# Patient Record
Sex: Female | Born: 1995 | Race: Black or African American | Hispanic: No | Marital: Single | Smoking: Current every day smoker
Health system: Southern US, Community
[De-identification: ages and names within clinical notes are randomized; demographics above are authoritative.]

## PROBLEM LIST (undated history)

## (undated) DIAGNOSIS — O039 Complete or unspecified spontaneous abortion without complication: Secondary | ICD-10-CM

## (undated) HISTORY — PX: DILATION AND CURETTAGE OF UTERUS: SHX78

---
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
## 2013-06-05 ENCOUNTER — Encounter (HOSPITAL_BASED_OUTPATIENT_CLINIC_OR_DEPARTMENT_OTHER): Payer: Self-pay | Admitting: Emergency Medicine

## 2013-06-05 ENCOUNTER — Emergency Department (HOSPITAL_BASED_OUTPATIENT_CLINIC_OR_DEPARTMENT_OTHER)
Admission: EM | Admit: 2013-06-05 | Discharge: 2013-06-05 | Disposition: A | Discharge status: Home / Self Care | Payer: No Typology Code available for payment source | Attending: Emergency Medicine | Admitting: Emergency Medicine

## 2013-06-05 DIAGNOSIS — B9789 Other viral agents as the cause of diseases classified elsewhere: Secondary | ICD-10-CM | POA: Insufficient documentation

## 2013-06-05 DIAGNOSIS — B349 Viral infection, unspecified: Secondary | ICD-10-CM

## 2013-06-05 DIAGNOSIS — Z3202 Encounter for pregnancy test, result negative: Secondary | ICD-10-CM | POA: Insufficient documentation

## 2013-06-05 DIAGNOSIS — F17200 Nicotine dependence, unspecified, uncomplicated: Secondary | ICD-10-CM | POA: Insufficient documentation

## 2013-06-05 LAB — PREGNANCY, URINE: Preg Test, Ur: NEGATIVE

## 2013-06-05 MED ORDER — ONDANSETRON 8 MG PO TBDP
8.0000 mg | ORAL_TABLET | Freq: Once | ORAL | Status: AC
  Administered 2013-06-05: 8 mg via ORAL
  Filled 2013-06-05: qty 1

## 2013-06-05 MED ORDER — NAPROXEN 375 MG PO TABS: 375.0000 mg | ORAL_TABLET | Freq: Two times a day (BID) | ORAL | Status: DC

## 2013-06-05 MED ORDER — DICYCLOMINE HCL 20 MG PO TABS: 20.0000 mg | ORAL_TABLET | Freq: Two times a day (BID) | ORAL | Status: DC

## 2013-06-05 MED ORDER — NAPROXEN 250 MG PO TABS
500.0000 mg | ORAL_TABLET | Freq: Once | ORAL | Status: AC
  Administered 2013-06-05: 500 mg via ORAL
  Filled 2013-06-05: qty 2

## 2013-06-05 MED ORDER — ACETAMINOPHEN 500 MG PO TABS
1000.0000 mg | ORAL_TABLET | Freq: Once | ORAL | Status: AC
  Administered 2013-06-05: 1000 mg via ORAL
  Filled 2013-06-05: qty 2

## 2013-06-05 MED ORDER — DICYCLOMINE HCL 10 MG PO CAPS
10.0000 mg | ORAL_CAPSULE | Freq: Once | ORAL | Status: AC
  Administered 2013-06-05: 10 mg via ORAL
  Filled 2013-06-05: qty 1

## 2013-06-05 MED ORDER — ONDANSETRON 8 MG PO TBDP: ORAL_TABLET | ORAL | Status: DC

## 2013-06-05 NOTE — ED Notes (Addendum)
C/o "migraine" h/a with vomiting that started on Wednesday but not getting better per pt. Some loose stools per pt. Denies any fevers. States she has not had any meds pta. States able to drink po fluids. States vomited 2 times since midnight.
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
## 2013-07-14 ENCOUNTER — Emergency Department (HOSPITAL_BASED_OUTPATIENT_CLINIC_OR_DEPARTMENT_OTHER): Payer: No Typology Code available for payment source

## 2013-07-14 ENCOUNTER — Encounter (HOSPITAL_BASED_OUTPATIENT_CLINIC_OR_DEPARTMENT_OTHER): Payer: Self-pay | Admitting: Emergency Medicine

## 2013-07-14 ENCOUNTER — Emergency Department (HOSPITAL_BASED_OUTPATIENT_CLINIC_OR_DEPARTMENT_OTHER)
Admission: EM | Admit: 2013-07-14 | Discharge: 2013-07-14 | Disposition: A | Discharge status: Home / Self Care | Payer: No Typology Code available for payment source | Attending: Emergency Medicine | Admitting: Emergency Medicine

## 2013-07-14 DIAGNOSIS — F17200 Nicotine dependence, unspecified, uncomplicated: Secondary | ICD-10-CM | POA: Insufficient documentation

## 2013-07-14 DIAGNOSIS — R5383 Other fatigue: Secondary | ICD-10-CM

## 2013-07-14 DIAGNOSIS — Z79899 Other long term (current) drug therapy: Secondary | ICD-10-CM | POA: Insufficient documentation

## 2013-07-14 DIAGNOSIS — Z791 Long term (current) use of non-steroidal anti-inflammatories (NSAID): Secondary | ICD-10-CM | POA: Insufficient documentation

## 2013-07-14 DIAGNOSIS — R5381 Other malaise: Secondary | ICD-10-CM | POA: Insufficient documentation

## 2013-07-14 DIAGNOSIS — J069 Acute upper respiratory infection, unspecified: Secondary | ICD-10-CM | POA: Insufficient documentation

## 2013-07-14 LAB — RAPID STREP SCREEN (MED CTR MEBANE ONLY): Streptococcus, Group A Screen (Direct): NEGATIVE

## 2013-07-14 NOTE — Discharge Instructions (Signed)

## 2013-07-14 NOTE — ED Provider Notes (Signed)
CSN: [id number]     Arrival date & time 07/14/13  1406 History  This chart was scribed for Glynn OctaveStephen Adon Gehlhausen, MD by Shari HeritageAisha Amuda, ED Scribe. The patient was seen in room MH08/MH08. Patient's care was started at 2:28 PM.  Chief Complaint  Patient presents with  . Sore Throat     The history is provided by the patient. No language interpreter was used.    HPI Comments: Joaquin Bendbony Zanella is a 18 y.o. female who presents to the Emergency Department complaining of constant, moderate sore throat onset yesterday. She reports itching in her throat upon swallowing. There is associated rhinorrhea, fatigue, body aches, and dry cough. She also reports a fever yesterday to 100, and her temperature today at triage was 98.3. She took Catering managerAlka Seltzer at home with no symptom relief. 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 She denies headache, chest pain, shortness of breath, nausea, vomiting, diarrhea or abdominal pain. Her LNMP was 2 weeks ago. She does not take any medications on a regular basis and has no chronic medical conditions. Patient smokes.   History reviewed. No pertinent past medical history. History reviewed. No pertinent past surgical history. No family history on file. History  Substance Use Topics  . Smoking status: Current Every Day Smoker  . Smokeless tobacco: Not on file  . Alcohol Use: Yes     Comment: occasionally   OB History   Grav Para Term Preterm Abortions TAB SAB Ect Mult Living                 Review of Systems A complete 10 system review of systems was obtained and all systems are negative except as noted in the HPI and PMH.     Allergies  Review of patient's allergies indicates no known allergies.  Home Medications   Prior to Admission medications   Medication Sig Start Date End Date Taking? Authorizing Provider  dicyclomine (BENTYL) 20 MG tablet Take 1 tablet (20 mg total) by mouth 2 (two) times daily. 06/05/13   April K Palumbo-Rasch, MD   naproxen (NAPROSYN) 375 MG tablet Take 1 tablet (375 mg total) by mouth 2 (two) times daily. 06/05/13   April K Palumbo-Rasch, MD  ondansetron [facility](ZOFRAN ODT) 8 MG disintegrating tablet 8mg  ODT q8 hours prn nausea 06/05/13   April K Palumbo-Rasch, MD   Triage Vitals: BP 111/52  Pulse 83  Temp(Src) 98.3 F (36.8 C) (Oral)  Resp 16  SpO2 100% Physical Exam  Constitutional: She is oriented to person, place, and time. She appears well-developed and well-nourished. No distress.  HENT:  Head: Normocephalic and atraumatic.  Mouth/Throat: Oropharynx is clear and moist. No oropharyngeal exudate, posterior oropharyngeal edema or posterior oropharyngeal erythema.  Eyes: Conjunctivae and EOM are normal.  Neck: Normal range of motion. Neck supple.  No meningismus.  Cardiovascular: Normal rate, regular rhythm and normal heart sounds.  Exam reveals no gallop and no friction rub.   No murmur heard. Pulmonary/Chest: Effort normal and breath sounds normal. No respiratory distress. She has no wheezes. She has no rales.  Abdominal: Soft. There is no tenderness.  Musculoskeletal: Normal range of motion.  Neurological: She is alert and oriented to person, place, and time.  Skin: Skin is warm and dry.  Psychiatric: She has a normal mood and affect. Her behavior is normal.    ED Course  Procedures (including critical care time) DIAGNOSTIC STUDIES: Oxygen Saturation is 100% on room air, normal by my interpretation.  COORDINATION OF CARE: 2:32 PM- Will order CXR and strep screen. Patient informed of current plan for treatment and evaluation and agrees with plan at this time.  Results for orders placed during the hospital encounter of 07/14/13  RAPID STREP SCREEN      Result Value Ref Range   Streptococcus, Group A Screen (Direct) NEGATIVE  NEGATIVE    Imaging Review Dg Chest 2 View  07/14/2013   CLINICAL DATA:  Two-day history of cough and congestion  EXAM: CHEST  2 VIEW  COMPARISON:  None.  FINDINGS:  The lungs are well-expanded and clear. The cardiopericardial silhouette is normal in size. The pulmonary vascularity is not engorged. The mediastinum is normal in width. There is no pleural effusion or pneumothorax. The observed portions of the bony thorax appear normal.  IMPRESSION: There is no evidence of pneumonia. Mild hyperinflation may be voluntary or could reflect acute bronchitis with air trapping.   Electronically Signed   By: David  SwazilandJordan   On: 07/14/2013 15:06     EKG Interpretation None      MDM   Final diagnoses:  Upper respiratory infection   1 day history of sore throat, bodyaches, chills, rhinorrhea.  No chest pain or SOB.  Vitals stable, no distress.  Well appearing, OP clear, lungs clear. CXR negative. Supportive care for viral syndrome, PO hydration, antipyretics, return precautions discussed.    I personally performed the services described in this documentation, which was scribed in my presence. The recorded information has been reviewed and is accurate.    Glynn OctaveStephen Tayte Childers, MD 07/14/13 [phone]

## 2013-07-14 NOTE — ED Notes (Signed)
Present to ED today for sore throat, onset yesterday, itchy throat upon swallowing. Other symptoms are intermittent fever and chills, cough, running nose and itchy eyes. Afebrile in room. NAD. Tonsils present but no erythemia or swelling noted.

## 2013-07-16 LAB — CULTURE, GROUP A STREP

## 2014-04-19 ENCOUNTER — Emergency Department (HOSPITAL_BASED_OUTPATIENT_CLINIC_OR_DEPARTMENT_OTHER)
Admission: EM | Admit: 2014-04-19 | Discharge: 2014-04-19 | Disposition: A | Discharge status: Home / Self Care | Payer: No Typology Code available for payment source | Attending: Emergency Medicine | Admitting: Emergency Medicine

## 2014-04-19 ENCOUNTER — Encounter (HOSPITAL_BASED_OUTPATIENT_CLINIC_OR_DEPARTMENT_OTHER): Payer: Self-pay

## 2014-04-19 DIAGNOSIS — S6992XA Unspecified injury of left wrist, hand and finger(s), initial encounter: Secondary | ICD-10-CM | POA: Insufficient documentation

## 2014-04-19 DIAGNOSIS — M25532 Pain in left wrist: Secondary | ICD-10-CM

## 2014-04-19 DIAGNOSIS — Z720 Tobacco use: Secondary | ICD-10-CM | POA: Insufficient documentation

## 2014-04-19 DIAGNOSIS — Y92410 Unspecified street and highway as the place of occurrence of the external cause: Secondary | ICD-10-CM | POA: Insufficient documentation

## 2014-04-19 DIAGNOSIS — S299XXA Unspecified injury of thorax, initial encounter: Secondary | ICD-10-CM | POA: Diagnosis present

## 2014-04-19 DIAGNOSIS — Z79899 Other long term (current) drug therapy: Secondary | ICD-10-CM | POA: Insufficient documentation

## 2014-04-19 DIAGNOSIS — Y998 Other external cause status: Secondary | ICD-10-CM | POA: Diagnosis not present

## 2014-04-19 DIAGNOSIS — Y9389 Activity, other specified: Secondary | ICD-10-CM | POA: Insufficient documentation

## 2014-04-19 DIAGNOSIS — R0789 Other chest pain: Secondary | ICD-10-CM

## 2014-04-19 MED ORDER — IBUPROFEN 800 MG PO TABS
800.0000 mg | ORAL_TABLET | Freq: Once | ORAL | Status: AC
  Administered 2014-04-19: 800 mg via ORAL
  Filled 2014-04-19: qty 1
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
## 2014-04-19 NOTE — Discharge Instructions (Signed)
Return to the emergency room with worsening of symptoms, new symptoms or with symptoms that are concerning , especially worsening chest pain, shortness of breath, difficulty breathing, chest pain that feels like a pressure, spreads to left arm or jaw, worse with exertion, associated with nausea, vomiting, shortness of breath and/or sweating.  RICE: Rest, Ice (three cycles of 20 mins on, 20mins off at least twice a day), compression/brace, elevation. Heating pad works well for back pain. Ibuprofen 400mg  (2 tablets 200mg ) every 5-6 hours for 3-5 days.  Chest Wall Pain Chest wall pain is pain in or around the bones and muscles of your chest. It may take up to 6 weeks to get better. It may take longer if you must stay physically active in your work and activities.  CAUSES  Chest wall pain may happen on its own. However, it may be caused by:  A viral illness like the flu.  Injury.  Coughing.  Exercise.  Arthritis.  Fibromyalgia.  Shingles. HOME CARE INSTRUCTIONS   Avoid overtiring physical activity. Try not to strain or perform activities that cause pain. This includes any activities using your chest or your abdominal and side muscles, especially if heavy weights are used.  Put ice on the sore area.  Put ice in a plastic bag.  Place a towel between your skin and the bag.  Leave the ice on for 15-20 minutes per hour while awake for the first 2 days.  Only take over-the-counter or prescription medicines for pain, discomfort, or fever as directed by your caregiver. SEEK IMMEDIATE MEDICAL CARE IF:   Your pain increases, or you are very uncomfortable.  You have a fever.  Your chest pain becomes worse.  You have new, unexplained symptoms.  You have nausea or vomiting.  You feel sweaty or lightheaded.  You have a cough with phlegm (sputum), or you cough up blood. MAKE SURE YOU:   Understand these instructions.  Will watch your condition.  Will get help right away if you  are not doing well or get worse. Document Released: 03/16/2005 Document Revised: 06/08/2011 Document Reviewed: 11/10/2010 [facility] Patient Information 2015 [employer] [place]. This information is not intended to replace advice given to you by your health care provider. Make sure you discuss any questions you have with your health care provider.  Wrist Pain Wrist injuries are frequent in adults and children. A sprain is an injury to the ligaments that hold your bones together. A strain is an injury to muscle or muscle cord-like structures (tendons) from stretching or pulling. Generally, when wrists are moderately tender to touch following a fall or injury, a break in the bone (fracture) may be present. Most wrist sprains or strains are better in 3 to 5 days, but complete healing may take several weeks. HOME CARE INSTRUCTIONS   Put ice on the injured area.  Put ice in a plastic bag.  Place a towel between your skin and the bag.  Leave the ice on for 15-20 minutes, 3-4 times a day, for the first 2 days, or as directed by your health care provider.  Keep your arm raised above the level of your heart whenever possible to reduce swelling and pain.  Rest the injured area for at least 48 hours or as directed by your health care provider.  If a splint or elastic bandage has been applied, use it for as long as directed by your health care provider or until seen by a health care provider for a follow-up exam.  Only take over-the-counter or prescription medicines for pain, discomfort, or fever as directed by your health care provider.  Keep all follow-up appointments. You may need to follow up with a specialist or have follow-up X-rays. Improvement in pain level is not a guarantee that you did not fracture a bone in your wrist. The only way to determine whether or not you have a broken bone is by X-ray. SEEK IMMEDIATE MEDICAL CARE IF:   Your fingers are swollen, very red, white, or cold and blue.  Your  fingers are numb or tingling.  You have increasing pain.  You have difficulty moving your fingers. MAKE SURE YOU:   Understand these instructions.  Will watch your condition.  Will get help right away if you are not doing well or get worse. Document Released: 12/24/2004 Document Revised: 03/21/2013 Document Reviewed: 05/07/2010 [facility] Patient Information 2015 [place], [place]. This information is not intended to replace advice given to you by your health care provider. Make sure you discuss any questions you have with your health care provider.
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
## 2015-04-08 ENCOUNTER — Emergency Department (HOSPITAL_BASED_OUTPATIENT_CLINIC_OR_DEPARTMENT_OTHER)
Admission: EM | Admit: 2015-04-08 | Discharge: 2015-04-08 | Disposition: A | Discharge status: Home / Self Care | Payer: No Typology Code available for payment source | Attending: Emergency Medicine | Admitting: Emergency Medicine

## 2015-04-08 ENCOUNTER — Encounter (HOSPITAL_BASED_OUTPATIENT_CLINIC_OR_DEPARTMENT_OTHER): Payer: Self-pay

## 2015-04-08 DIAGNOSIS — F17200 Nicotine dependence, unspecified, uncomplicated: Secondary | ICD-10-CM | POA: Insufficient documentation

## 2015-04-08 DIAGNOSIS — L02416 Cutaneous abscess of left lower limb: Secondary | ICD-10-CM | POA: Insufficient documentation

## 2015-04-08 DIAGNOSIS — L02214 Cutaneous abscess of groin: Secondary | ICD-10-CM

## 2015-04-08 MED ORDER — HYDROCODONE-ACETAMINOPHEN 5-325 MG PO TABS: 1.0000 | ORAL_TABLET | Freq: Four times a day (QID) | ORAL | Status: DC | PRN

## 2015-04-08 MED ORDER — LIDOCAINE HCL (PF) 1 % IJ SOLN
INTRAMUSCULAR | Status: AC
Start: 2015-04-08 — End: 2015-04-08
  Administered 2015-04-08: 5 mL
  Filled 2015-04-08: qty 5

## 2015-04-08 MED ORDER — SULFAMETHOXAZOLE-TRIMETHOPRIM 800-160 MG PO TABS: 1.0000 | ORAL_TABLET | Freq: Two times a day (BID) | ORAL | Status: AC

## 2015-04-08 MED FILL — SULFAMETHOXAZOLE-TMP DS TAB: 800-160 | 7 days supply | Qty: 14 | Fill #0

## 2015-04-08 MED FILL — HYDROCODON-APAP 5-325: 5-325 | 3 days supply | Qty: 15 | Fill #0

## 2015-04-08 NOTE — ED Provider Notes (Signed)
CSN: [id number]     Arrival date & time 04/08/15  1528 History  By signing my name below, I, Budd PalmerVanessa Kelley, attest that this documentation has been prepared under the direction and in the presence of Geoffery Lyonsouglas Christopherjohn Schiele, MD. Electronically Signed: Budd PalmerVanessa Kelley, ED Scribe. 04/08/2015. 4:34 PM.     Chief Complaint  Patient presents with  . Abscess   Patient is a 20 y.o. female presenting with abscess. The history is provided by the patient. No language interpreter was used.  Abscess Location:  Leg Leg abscess location:  L upper leg Size:  2cm by 3cm  Abscess quality: fluctuance and painful   Duration:  4 days Progression:  Improving Pain details:    Severity:  Mild   Duration:  4 days   Progression:  Improving Chronicity:  Recurrent Relieved by:  Warm compresses Associated symptoms: no fever   Risk factors: prior abscess    HPI Comments: Jill Kelley is a 20 y.o. female smoker who presents to the Emergency Department complaining of a painful, growing abscess to the left inner thigh onset 4 days ago. She notes the pain has been easing off today. She reports a PMHx of the same, which she notes typically will reduce in size and resolve on their own, but because this one has not, she came to the ED. She notes she has applied a warm compress which reduced the size of the abscess, but did not resolve it. Pt denies fever. She reports NKDA.  History reviewed. No pertinent past medical history. History reviewed. No pertinent past surgical history. No family history on file. Social History  Substance Use Topics  . Smoking status: Current Every Day Smoker  . Smokeless tobacco: None  . Alcohol Use: Yes     Comment: daily   OB History    No data available     Review of Systems  Constitutional: Negative for fever.  Musculoskeletal: Positive for myalgias.  Skin: Positive for color change.  All other systems reviewed and are negative.   Allergies  Review of patient's allergies indicates no  known allergies.  Home Medications   Prior to Admission medications   Medication Sig Start Date End Date Taking? Authorizing Provider  HYDROcodone-acetaminophen (NORCO) 5-325 MG tablet Take 1-2 tablets by mouth every 6 (six) hours as needed. 04/08/15   Geoffery Lyonsouglas Kesa Birky, MD  sulfamethoxazole-trimethoprim (BACTRIM DS,SEPTRA DS) 800-160 MG tablet Take 1 tablet by mouth 2 (two) times daily. 04/08/15 04/15/15  Geoffery Lyonsouglas Lenwood Balsam, MD   BP 120/68 mmHg  Pulse 82  Temp(Src) 98.9 F (37.2 C) (Oral)  Resp 16  Ht 5\' 5"  (1.651 m)  Wt 180 lb (81.647 kg)  BMI 29.95 kg/m2  SpO2 100%  LMP 04/01/2015 Physical Exam  Constitutional: She is oriented to person, place, and time. She appears well-developed and well-nourished.  HENT:  Head: Normocephalic and atraumatic.  Eyes: Conjunctivae are normal. Right eye exhibits no discharge. Left eye exhibits no discharge.  Pulmonary/Chest: Effort normal. No respiratory distress.  Neurological: She is alert and oriented to person, place, and time. Coordination normal.  Skin: Skin is warm and dry. No rash noted. She is not diaphoretic. No erythema.  The medial aspect of the left upper thigh has a 2 cm by 3 cm fluctuant, tender area.   Psychiatric: She has a normal mood and affect.  Nursing note and vitals reviewed.   ED Course  Procedures  DIAGNOSTIC STUDIES: Oxygen Saturation is 100% on RA, normal by my interpretation.    COORDINATION OF  CARE: 3:58 PM - Discussed plans to perform an I&D and to prescribe antibiotics. Will give a note for work. Pt advised of plan for treatment and pt agrees.  4:08 PM- Advised pt to use warm water soaks to keep the area clean. Will order bactrim and Norco for pain. Pt advised of plan for treatment and pt agrees.   INCISION AND DRAINAGE PROCEDURE NOTE: Patient identification was confirmed and verbal consent was obtained. This procedure was performed by Geoffery Lyons, MD at 4:05 PM. Site: Medial aspect of the left upper thigh Sterile  procedures observed Needle size: 27 gage Anesthetic used (type and amt): Lidocaine 1% injection 3 mL Blade size: 11 Drainage: Purulent  Complexity: Complex Site anesthetized, incision made over site, wound drained and explored loculations, rinsed with copious amounts of normal saline, wound packed with sterile gauze, covered with dry, sterile dressing.  Pt tolerated procedure well without complications.  Instructions for care discussed verbally and pt provided with additional written instructions for homecare and f/u.   Labs Review Labs Reviewed - No data to display  Imaging Review No results found. I have personally reviewed and evaluated these images and lab results as part of my medical decision-making.   EKG Interpretation None      MDM   Final diagnoses:  Abscess of left groin    Abscess I&D of purulent material. She will be treated with Bactrim, warm soaks, and when necessary return.  I personally performed the services described in this documentation, which was scribed in my presence. The recorded information has been reviewed and is accurate.      Geoffery Lyons, MD 04/08/15 [phone]

## 2015-04-08 NOTE — ED Notes (Signed)
C/o abscess to left thigh x 4 days

## 2015-04-08 NOTE — Discharge Instructions (Signed)
Bactrim as prescribed.  Hydrocodone as prescribed as needed for pain.  Apply warm soaks as frequently as possible for the next several days.  Return to the emergency department if symptoms significantly worsen or change.   Abscess An abscess is an infected area that contains a collection of pus and debris.It can occur in almost any part of the body. An abscess is also known as a furuncle or boil. CAUSES  An abscess occurs when tissue gets infected. This can occur from blockage of oil or sweat glands, infection of hair follicles, or a minor injury to the skin. As the body tries to fight the infection, pus collects in the area and creates pressure under the skin. This pressure causes pain. People with weakened immune systems have difficulty fighting infections and get certain abscesses more often.  SYMPTOMS Usually an abscess develops on the skin and becomes a painful mass that is red, warm, and tender. If the abscess forms under the skin, you may feel a moveable soft area under the skin. Some abscesses break open (rupture) on their own, but most will continue to get worse without care. The infection can spread deeper into the body and eventually into the bloodstream, causing you to feel ill.  DIAGNOSIS  Your caregiver will take your medical history and perform a physical exam. A sample of fluid may also be taken from the abscess to determine what is causing your infection. TREATMENT  Your caregiver may prescribe antibiotic medicines to fight the infection. However, taking antibiotics alone usually does not cure an abscess. Your caregiver may need to make a small cut (incision) in the abscess to drain the pus. In some cases, gauze is packed into the abscess to reduce pain and to continue draining the area. HOME CARE INSTRUCTIONS   Only take over-the-counter or prescription medicines for pain, discomfort, or fever as directed by your caregiver.  If you were prescribed antibiotics, take them as  directed. Finish them even if you start to feel better.  If gauze is used, follow your caregiver's directions for changing the gauze.  To avoid spreading the infection:  Keep your draining abscess covered with a bandage.  Wash your hands well.  Do not share personal care items, towels, or whirlpools with others.  Avoid skin contact with others.  Keep your skin and clothes clean around the abscess.  Keep all follow-up appointments as directed by your caregiver. SEEK MEDICAL CARE IF:   You have increased pain, swelling, redness, fluid drainage, or bleeding.  You have muscle aches, chills, or a general ill feeling.  You have a fever. MAKE SURE YOU:   Understand these instructions.  Will watch your condition.  Will get help right away if you are not doing well or get worse.   This information is not intended to replace advice given to you by your health care provider. Make sure you discuss any questions you have with your health care provider.   Document Released: 12/24/2004 Document Revised: 09/15/2011 Document Reviewed: 05/29/2011 Elsevier Interactive Patient Education [employer]2016 Elsevier Inc.

## 2015-11-04 ENCOUNTER — Emergency Department (HOSPITAL_BASED_OUTPATIENT_CLINIC_OR_DEPARTMENT_OTHER)
Admission: EM | Admit: 2015-11-04 | Discharge: 2015-11-04 | Disposition: A | Discharge status: Home / Self Care | Payer: No Typology Code available for payment source | Attending: Emergency Medicine | Admitting: Emergency Medicine

## 2015-11-04 ENCOUNTER — Encounter (HOSPITAL_BASED_OUTPATIENT_CLINIC_OR_DEPARTMENT_OTHER): Payer: Self-pay

## 2015-11-04 DIAGNOSIS — R1084 Generalized abdominal pain: Secondary | ICD-10-CM | POA: Insufficient documentation

## 2015-11-04 DIAGNOSIS — F17210 Nicotine dependence, cigarettes, uncomplicated: Secondary | ICD-10-CM | POA: Insufficient documentation

## 2015-11-04 LAB — URINE MICROSCOPIC-ADD ON

## 2015-11-04 LAB — URINALYSIS, ROUTINE W REFLEX MICROSCOPIC
BILIRUBIN URINE: NEGATIVE
Glucose, UA: NEGATIVE mg/dL
KETONES UR: NEGATIVE mg/dL
Leukocytes, UA: NEGATIVE
NITRITE: NEGATIVE
Protein, ur: NEGATIVE mg/dL
Specific Gravity, Urine: 1.021 (ref 1.005–1.030)
pH: 8.5 — ABNORMAL HIGH (ref 5.0–8.0)

## 2015-11-04 LAB — PREGNANCY, URINE: PREG TEST UR: NEGATIVE

## 2015-11-04 NOTE — ED Triage Notes (Signed)
C/o abd pain, burping started last Friday-NAD-steady gait

## 2015-11-04 NOTE — Discharge Instructions (Signed)
Avoid greasy fried or fatty foods. Avoid fast food such as [employer]. It lots of fruits and vegetables. Take Gas-X as directed for gas. See your primary care physician at [facility] if not improved in a week

## 2015-11-04 NOTE — ED Provider Notes (Addendum)
[facility] Provider Note   CSN: [id number] Arrival date & time: 11/04/15  1442  First Provider Contact:   First MD Initiated Contact with Patient 11/04/15 1643       By signing my name below, I, Jill Kelley, attest that this documentation has been prepared under the direction and in the presence of Doug SouSam [name], MD. Electronically Signed: Soijett Kelley, ED Scribe. 11/04/15. 5:10 PM.    History   Chief Complaint Chief Complaint  Patient presents with  . Abdominal Pain    HPI  Jill Kelley is a 20 y.o. female who presents to the Emergency Department complaining of Generalized diffuse abdominal pain onset 2 weeks. Pt describes her abdominal pain as a "gassy" sensation that is intermittently alleviated with bowel movements or burping. Pt states that she doesn't have any abdominal pain at this time. Pt reports that she last ate [employer] today. Pt notes that her last bowel movement was today and was normal. Pt states that she is currently sexually active. She states that she is having associated symptoms of constipation x 1 week. She states that she has not tried any medications for the relief for her symptoms. She denies blood in stool, dysuria, fever, dyspareunia, and any other symptoms. Pt notes that she does smoke cigarettes. Pt endorses ETOH use and marijuana use. Pt states that she did have a PCP at [facility] who she saw last year.  Denies allergies to medications.   The history is provided by the patient. No language interpreter was used.    History reviewed. No pertinent past medical history.  There are no active problems to display for this patient.   History reviewed. No pertinent surgical history.  OB History    No data available       Home Medications    Prior to Admission medications   Not on File    Family History No family history on file.  Social History Social History  Substance Use Topics  . Smoking status: Current Every Day Smoker   Types: Cigarettes  . Smokeless tobacco: Never Used  . Alcohol use Yes     Comment: daily     Allergies   Review of patient's allergies indicates no known allergies.   Review of Systems Review of Systems  Constitutional: Negative.   HENT: Negative.   Respiratory: Negative.   Cardiovascular: Negative.   Gastrointestinal: Positive for abdominal pain and constipation.  Musculoskeletal: Negative.   Skin: Negative.   Neurological: Negative.   Psychiatric/Behavioral: Negative.   All other systems reviewed and are negative.    Physical Exam Updated Vital Signs BP 132/61 (BP Location: Right Arm)   Pulse 70   Temp 98.7 F (37.1 C) (Oral)   Resp 16   Ht 5\' 5"  (1.651 m)   Wt 166 lb (75.3 kg)   LMP 11/04/2015   SpO2 100%   BMI 27.62 kg/m   Physical Exam  Constitutional: She appears well-developed and well-nourished.  HENT:  Head: Normocephalic and atraumatic.  Eyes: Conjunctivae are normal. Pupils are equal, round, and reactive to light.  Neck: Neck supple. No tracheal deviation present. No thyromegaly present.  Cardiovascular: Normal rate and regular rhythm.   No murmur heard. Pulmonary/Chest: Effort normal and breath sounds normal.  Abdominal: Soft. Bowel sounds are normal. She exhibits no distension. There is no tenderness.  Obese  Musculoskeletal: Normal range of motion. She exhibits no edema or tenderness.  Neurological: She is alert. Coordination normal.  Skin: Skin is warm and dry.  No rash noted.  Psychiatric: She has a normal mood and affect.  Nursing note and vitals reviewed.    ED Treatments / Results  DIAGNOSTIC STUDIES: Oxygen Saturation is 100% on RA, nl by my interpretation.    COORDINATION OF CARE: 5:10 PM Discussed treatment plan with pt at bedside which includes UA and pt agreed to plan.   Labs (all labs ordered are listed, but only abnormal results are displayed) Labs Reviewed  PREGNANCY, URINE  URINALYSIS, ROUTINE W REFLEX MICROSCOPIC (NOT  AT [facility])    EKG  EKG Interpretation None       Radiology No results found.  Procedures Procedures (including critical care time)  Medications Ordered in ED Medications - No data to display   Initial Impression / Assessment and Plan / ED Course  I have reviewed the triage vital signs and the nursing notes.  Pertinent labs & imaging results that were available during my care of the patient were reviewed by me and considered in my medical decision making (see chart for details).  Clinical Course   6 PM remains asymptomatic. Results for orders placed or performed during the hospital encounter of 11/04/15  Pregnancy, urine  Result Value Ref Range   Preg Test, Ur NEGATIVE NEGATIVE  Urinalysis, Routine w reflex microscopic (not at [facility])  Result Value Ref Range   Color, Urine YELLOW YELLOW   APPearance CLEAR CLEAR   Specific Gravity, Urine 1.021 1.005 - 1.030   pH 8.5 (H) 5.0 - 8.0   Glucose, UA NEGATIVE NEGATIVE mg/dL   Hgb urine dipstick TRACE (A) NEGATIVE   Bilirubin Urine NEGATIVE NEGATIVE   Ketones, ur NEGATIVE NEGATIVE mg/dL   Protein, ur NEGATIVE NEGATIVE mg/dL   Nitrite NEGATIVE NEGATIVE   Leukocytes, UA NEGATIVE NEGATIVE  Urine microscopic-add on  Result Value Ref Range   Squamous Epithelial / LPF 0-5 (A) NONE SEEN   WBC, UA 0-5 0 - 5 WBC/hpf   RBC / HPF 0-5 0 - 5 RBC/hpf   Bacteria, UA RARE (A) NONE SEEN   Urine-Other MUCOUS PRESENT    No results found. Suggest Gas-X as directed. Avoid greasy fried or fatty foods or fast foods. Follow-up at [facility]D at [facility] if not better in a week Final Clinical Impressions(s) / ED Diagnoses  Diagnosis nonspecific abdominal pain Final diagnoses:  None    New Prescriptions New Prescriptions   No medications on file        Doug Sou, MD 11/04/15 1813    Doug Sou, MD 11/04/15 1191

## 2016-06-01 ENCOUNTER — Encounter (HOSPITAL_BASED_OUTPATIENT_CLINIC_OR_DEPARTMENT_OTHER): Payer: Self-pay

## 2016-06-01 ENCOUNTER — Emergency Department (HOSPITAL_BASED_OUTPATIENT_CLINIC_OR_DEPARTMENT_OTHER)
Admission: EM | Admit: 2016-06-01 | Discharge: 2016-06-02 | Disposition: A | Discharge status: Home / Self Care | Payer: Medicaid Other | Attending: Emergency Medicine | Admitting: Emergency Medicine

## 2016-06-01 ENCOUNTER — Emergency Department (HOSPITAL_BASED_OUTPATIENT_CLINIC_OR_DEPARTMENT_OTHER): Payer: Medicaid Other

## 2016-06-01 DIAGNOSIS — Z87891 Personal history of nicotine dependence: Secondary | ICD-10-CM | POA: Insufficient documentation

## 2016-06-01 DIAGNOSIS — O209 Hemorrhage in early pregnancy, unspecified: Secondary | ICD-10-CM | POA: Diagnosis present

## 2016-06-01 DIAGNOSIS — Z3A01 Less than 8 weeks gestation of pregnancy: Secondary | ICD-10-CM | POA: Insufficient documentation

## 2016-06-01 DIAGNOSIS — N939 Abnormal uterine and vaginal bleeding, unspecified: Secondary | ICD-10-CM

## 2016-06-01 DIAGNOSIS — O200 Threatened abortion: Secondary | ICD-10-CM | POA: Insufficient documentation

## 2016-06-01 LAB — CBC WITH DIFFERENTIAL/PLATELET
BASOS PCT: 0 %
Basophils Absolute: 0 10*3/uL (ref 0.0–0.1)
Eosinophils Absolute: 0.1 10*3/uL (ref 0.0–0.7)
Eosinophils Relative: 1 %
HCT: 33 % — ABNORMAL LOW (ref 36.0–46.0)
Hemoglobin: 11.1 g/dL — ABNORMAL LOW (ref 12.0–15.0)
Lymphocytes Relative: 27 %
Lymphs Abs: 2.6 10*3/uL (ref 0.7–4.0)
MCH: 23.5 pg — AB (ref 26.0–34.0)
MCHC: 33.6 g/dL (ref 30.0–36.0)
MCV: 69.9 fL — ABNORMAL LOW (ref 78.0–100.0)
MONO ABS: 1.1 10*3/uL — AB (ref 0.1–1.0)
Monocytes Relative: 11 %
NEUTROS PCT: 61 %
Neutro Abs: 5.8 10*3/uL (ref 1.7–7.7)
PLATELETS: 270 10*3/uL (ref 150–400)
RBC: 4.72 MIL/uL (ref 3.87–5.11)
RDW: 13.1 % (ref 11.5–15.5)
WBC: 9.6 10*3/uL (ref 4.0–10.5)

## 2016-06-01 LAB — URINALYSIS, ROUTINE W REFLEX MICROSCOPIC
Bilirubin Urine: NEGATIVE
GLUCOSE, UA: NEGATIVE mg/dL
KETONES UR: NEGATIVE mg/dL
LEUKOCYTES UA: NEGATIVE
NITRITE: NEGATIVE
PROTEIN: NEGATIVE mg/dL
Specific Gravity, Urine: 1.019 (ref 1.005–1.030)
pH: 7 (ref 5.0–8.0)

## 2016-06-01 LAB — PREGNANCY, URINE: PREG TEST UR: POSITIVE — AB

## 2016-06-01 LAB — ABO/RH: ABO/RH(D): O POS

## 2016-06-01 LAB — URINALYSIS, MICROSCOPIC (REFLEX)

## 2016-06-01 LAB — WET PREP, GENITAL
Clue Cells Wet Prep HPF POC: NONE SEEN
Sperm: NONE SEEN
TRICH WET PREP: NONE SEEN
YEAST WET PREP: NONE SEEN

## 2016-06-01 LAB — HCG, QUANTITATIVE, PREGNANCY: HCG, BETA CHAIN, QUANT, S: 4949 m[IU]/mL — AB (ref <5)

## 2016-06-01 NOTE — ED Triage Notes (Signed)
C/o vaginal bleeding x 1 hour-pad x first in place-pt states she is [redacted] weeks pregnant from [facility]- G1-no US-first OB appt for 3/12-NAD-steady gait

## 2016-06-01 NOTE — ED Provider Notes (Signed)
[facility] Provider Note   CSN: [id number] Arrival date & time: 06/01/16  2016   By signing my name below, I, Clovis Pu, attest that this documentation has been prepared under the direction and in the presence of Tilden Fossa, MD  Electronically Signed: Clovis Pu, ED Scribe. 06/01/16. 9:51 PM.   History   Chief Complaint Chief Complaint  Patient presents with  . Vaginal Bleeding  . Routine Prenatal Visit   The history is provided by the patient. No language interpreter was used.   HPI Comments:  Jill Kelley is a 21 y.o. female who presents to the Emergency Department complaining of acute onset, mild vaginal bleeding onset 1 hour PTA. Pt also reports brown discharge and notes she is [redacted] weeks pregnant based off of her cycle. No alleviating factors noted. Pt denies abdominal pain, nausea, vomiting, any recent sexual intercourse, any recent abdominal trauma or any other associated symptoms. No other complaints noted.   History reviewed. No pertinent past medical history.  There are no active problems to display for this patient.   History reviewed. No pertinent surgical history.  OB History    Gravida Para Term Preterm AB Living   1             SAB TAB Ectopic Multiple Live Births                   Home Medications    Prior to Admission medications   Not on File    Family History No family history on file.  Social History Social History  Substance Use Topics  . Smoking status: Former Games developer  . Smokeless tobacco: Never Used  . Alcohol use No     Allergies   Patient has no known allergies.   Review of Systems Review of Systems  Gastrointestinal: Negative for abdominal pain, nausea and vomiting.  Genitourinary: Positive for vaginal bleeding and vaginal discharge.  All other systems reviewed and are negative.  Physical Exam Updated Vital Signs BP 139/81 (BP Location: Left Arm)   Pulse 100   Temp 98.8 F (37.1 C) (Oral)   Resp 18    Ht 5\' 6"  (1.676 m)   Wt 177 lb (80.3 kg)   LMP 03/15/2016   SpO2 100%   BMI 28.57 kg/m   Physical Exam  Constitutional: She is oriented to person, place, and time. She appears well-developed and well-nourished.  HENT:  Head: Normocephalic and atraumatic.  Cardiovascular: Normal rate and regular rhythm.   No murmur heard. Pulmonary/Chest: Effort normal and breath sounds normal. No respiratory distress.  Abdominal: Soft. There is no tenderness. There is no rebound and no guarding.  Genitourinary:  Genitourinary Comments: Moderate vaginal bleeding.  Os fingertip open.  No CMT.  Musculoskeletal: She exhibits no edema or tenderness.  Neurological: She is alert and oriented to person, place, and time.  Skin: Skin is warm and dry.  Psychiatric: She has a normal mood and affect. Her behavior is normal.  Nursing note and vitals reviewed.   ED Treatments / Results  DIAGNOSTIC STUDIES:  Oxygen Saturation is 100% on RA, normal by my interpretation.    COORDINATION OF CARE:  9:50 PM Discussed treatment plan with pt at bedside and pt agreed to plan.  Labs (all labs ordered are listed, but only abnormal results are displayed) Labs Reviewed  URINALYSIS, ROUTINE W REFLEX MICROSCOPIC - Abnormal; Notable for the following:       Result Value   Hgb urine dipstick LARGE (*)  All other components within normal limits  PREGNANCY, URINE - Abnormal; Notable for the following:    Preg Test, Ur POSITIVE (*)    All other components within normal limits  CBC WITH DIFFERENTIAL/PLATELET - Abnormal; Notable for the following:    Hemoglobin 11.1 (*)    HCT 33.0 (*)    MCV 69.9 (*)    MCH 23.5 (*)    Monocytes Absolute 1.1 (*)    All other components within normal limits  HCG, QUANTITATIVE, PREGNANCY - Abnormal; Notable for the following:    hCG, Beta Chain, Quant, S 4,949 (*)    All other components within normal limits  URINALYSIS, MICROSCOPIC (REFLEX) - Abnormal; Notable for the following:      Bacteria, UA FEW (*)    Squamous Epithelial / LPF 0-5 (*)    All other components within normal limits  WET PREP, GENITAL  ABO/RH  GC/CHLAMYDIA PROBE AMP ([place]) NOT AT [facility]    EKG  EKG Interpretation None       Radiology US Ob Comp < 14 Wks  Result Date: 06/01/2016 CLINICAL DATA:  Vaginal bleeding in the first trimester pregnancy. EXAM: OBSTETRIC <14 WK [place] AND TRANSVAGINAL OB US TECHNIQUE: Both transabdominal and transvaginal ultrasound examinations were performed for complete evaluation of the gestation as well as the maternal uterus, adnexal regions, and pelvic cul-de-sac. Transvaginal technique was performed to assess early pregnancy. COMPARISON:  None. FINDINGS: Intrauterine gestational sac: Single Yolk sac:  Not Visualized. Embryo:  Visualized. Cardiac Activity: Not Visualized. Heart Rate: 0  bpm CRL:  6.7  mm   6 w 4 d                  [place] EDC: 01/21/2017 Subchorionic hemorrhage:  Moderate along the fundal portion. Maternal uterus/adnexae: Corpus luteal cyst on the right measuring 2.9 cm in diameter. IMPRESSION: Mishapened gestational sac without yolk sac and no heartbeat. Moderate-sized perigestational hematoma. Findings are suspicious but not yet definitive for failed pregnancy. Recommend follow-up US in 10-14 days for definitive diagnosis. This recommendation follows SRU consensus guidelines: Diagnostic Criteria for Nonviable Pregnancy Early in the First Trimester. Malva Limes Med 2013; 161:0960-45. Electronically Signed   By: Tollie Eth M.D.   On: 06/01/2016 23:11   US Ob Transvaginal  Result Date: 06/01/2016 CLINICAL DATA:  Vaginal bleeding in the first trimester pregnancy. EXAM: OBSTETRIC <14 WK [place] AND TRANSVAGINAL OB US TECHNIQUE: Both transabdominal and transvaginal ultrasound examinations were performed for complete evaluation of the gestation as well as the maternal uterus, adnexal regions, and pelvic cul-de-sac. Transvaginal technique was performed to assess early  pregnancy. COMPARISON:  None. FINDINGS: Intrauterine gestational sac: Single Yolk sac:  Not Visualized. Embryo:  Visualized. Cardiac Activity: Not Visualized. Heart Rate: 0  bpm CRL:  6.7  mm   6 w 4 d                  [place] EDC: 01/21/2017 Subchorionic hemorrhage:  Moderate along the fundal portion. Maternal uterus/adnexae: Corpus luteal cyst on the right measuring 2.9 cm in diameter. IMPRESSION: Mishapened gestational sac without yolk sac and no heartbeat. Moderate-sized perigestational hematoma. Findings are suspicious but not yet definitive for failed pregnancy. Recommend follow-up US in 10-14 days for definitive diagnosis. This recommendation follows SRU consensus guidelines: Diagnostic Criteria for Nonviable Pregnancy Early in the First Trimester. Malva Limes Med 2013; 409:8119-14. Electronically Signed   By: Tollie Eth M.D.   On: 06/01/2016 23:11    Procedures Procedures (including  critical care time)  Medications Ordered in ED Medications - No data to display   Initial Impression / Assessment and Plan / ED Course  I have reviewed the triage vital signs and the nursing notes.  Pertinent labs & imaging results that were available during my care of the patient were reviewed by me and considered in my medical decision making (see chart for details).     Patient here for evaluation of vaginal bleeding. Examination and pelvic ultrasound are concerning for impending miscarriage.  Discussed with patient concerns. Consultation on home care, OB/GYN follow-up as well as close return precautions.  Final Clinical Impressions(s) / ED Diagnoses   Final diagnoses:  Vaginal bleeding  Ectopic pregnancy    New Prescriptions New Prescriptions   No medications on file  I personally performed the services described in this documentation, which was scribed in my presence. The recorded information has been reviewed and is accurate.     Tilden FossaElizabeth Michale Emmerich, MD 06/02/16 [phone]

## 2016-06-01 NOTE — ED Notes (Signed)
Back from US.

## 2016-06-01 NOTE — ED Notes (Signed)
Patient transported to Ultrasound 

## 2016-06-02 NOTE — ED Notes (Signed)
Alert, NAD, calm, interactive, resps e/u, speaking in clear complete sentences, no dyspnea noted, skin W&D, VSS, (denies: pain, sob, nausea, dizziness or visual changes). Family at BS. 

## 2016-06-02 NOTE — Discharge Instructions (Signed)
Please follow up with your OBGYN for recheck.  Your pregnancy hormone (beta HCG) was 4969 today.

## 2016-06-03 LAB — GC/CHLAMYDIA PROBE AMP (~~LOC~~) NOT AT ARMC
CHLAMYDIA, DNA PROBE: NEGATIVE
Neisseria Gonorrhea: NEGATIVE

## 2016-08-03 ENCOUNTER — Emergency Department (HOSPITAL_BASED_OUTPATIENT_CLINIC_OR_DEPARTMENT_OTHER)
Admission: EM | Admit: 2016-08-03 | Discharge: 2016-08-03 | Disposition: A | Discharge status: Home / Self Care | Payer: Medicaid Other | Attending: Emergency Medicine | Admitting: Emergency Medicine

## 2016-08-03 ENCOUNTER — Encounter (HOSPITAL_BASED_OUTPATIENT_CLINIC_OR_DEPARTMENT_OTHER): Payer: Self-pay | Admitting: Emergency Medicine

## 2016-08-03 DIAGNOSIS — N939 Abnormal uterine and vaginal bleeding, unspecified: Secondary | ICD-10-CM | POA: Diagnosis present

## 2016-08-03 DIAGNOSIS — Z87891 Personal history of nicotine dependence: Secondary | ICD-10-CM | POA: Diagnosis not present

## 2016-08-03 DIAGNOSIS — N946 Dysmenorrhea, unspecified: Secondary | ICD-10-CM | POA: Diagnosis not present

## 2016-08-03 LAB — WET PREP, GENITAL
Clue Cells Wet Prep HPF POC: NONE SEEN
SPERM: NONE SEEN
TRICH WET PREP: NONE SEEN
YEAST WET PREP: NONE SEEN

## 2016-08-03 LAB — CBC WITH DIFFERENTIAL/PLATELET
BASOS ABS: 0.1 10*3/uL (ref 0.0–0.1)
BASOS PCT: 1 %
EOS ABS: 0.1 10*3/uL (ref 0.0–0.7)
EOS PCT: 1 %
HCT: 32.8 % — ABNORMAL LOW (ref 36.0–46.0)
Hemoglobin: 11.1 g/dL — ABNORMAL LOW (ref 12.0–15.0)
LYMPHS ABS: 2 10*3/uL (ref 0.7–4.0)
Lymphocytes Relative: 20 %
MCH: 24.2 pg — AB (ref 26.0–34.0)
MCHC: 33.8 g/dL (ref 30.0–36.0)
MCV: 71.5 fL — ABNORMAL LOW (ref 78.0–100.0)
Monocytes Absolute: 0.8 10*3/uL (ref 0.1–1.0)
Monocytes Relative: 8 %
Neutro Abs: 6.9 10*3/uL (ref 1.7–7.7)
Neutrophils Relative %: 71 %
PLATELETS: 278 10*3/uL (ref 150–400)
RBC: 4.59 MIL/uL (ref 3.87–5.11)
RDW: 13.7 % (ref 11.5–15.5)
WBC: 9.8 10*3/uL (ref 4.0–10.5)

## 2016-08-03 LAB — URINALYSIS, MICROSCOPIC (REFLEX)

## 2016-08-03 LAB — BASIC METABOLIC PANEL
Anion gap: 10 (ref 5–15)
BUN: 9 mg/dL (ref 6–20)
CALCIUM: 9.2 mg/dL (ref 8.9–10.3)
CO2: 25 mmol/L (ref 22–32)
CREATININE: 0.59 mg/dL (ref 0.44–1.00)
Chloride: 103 mmol/L (ref 101–111)
GFR calc Af Amer: >60 mL/min (ref >60)
Glucose, Bld: 86 mg/dL (ref 65–99)
Potassium: 3.4 mmol/L — ABNORMAL LOW (ref 3.5–5.1)
SODIUM: 138 mmol/L (ref 135–145)

## 2016-08-03 LAB — PREGNANCY, URINE: PREG TEST UR: NEGATIVE

## 2016-08-03 LAB — URINALYSIS, ROUTINE W REFLEX MICROSCOPIC
BILIRUBIN URINE: NEGATIVE
Glucose, UA: NEGATIVE mg/dL
Ketones, ur: NEGATIVE mg/dL
NITRITE: NEGATIVE
PROTEIN: NEGATIVE mg/dL
Specific Gravity, Urine: 1.017 (ref 1.005–1.030)
pH: 8 (ref 5.0–8.0)

## 2016-08-03 MED ORDER — IBUPROFEN 800 MG PO TABS
800.0000 mg | ORAL_TABLET | Freq: Once | ORAL | Status: AC
  Administered 2016-08-03: 800 mg via ORAL
  Filled 2016-08-03: qty 1

## 2016-08-03 MED ORDER — NAPROXEN 250 MG PO TABS: 250.0000 mg | ORAL_TABLET | Freq: Two times a day (BID) | ORAL | 0 refills | Status: AC

## 2016-08-03 NOTE — ED Provider Notes (Signed)
[facility]   CSN: [id number] Arrival date & time: 08/03/16  1716  By signing my name below, I, Nelwyn SalisburyJoshua Fowler, attest that this documentation has been prepared under the direction and in the presence of non-physician practitioner, Everlene FarrierWilliam Kapena Hamme, PA-C. Electronically Signed: Nelwyn SalisburyJoshua Fowler, Scribe. 08/03/2016. 5:41 PM.  History   Chief Complaint Chief Complaint  Patient presents with  . Pelvic Pain  . Vaginal Bleeding   The history is provided by the patient and medical records. No language interpreter was used.    HPI Comments:  Jill Kelley is an otherwise healthy 21 y.o. female who presents to the Emergency Department complaining of constant, mild pelvic pain onset 5 days. Pt describes her pelvic pain as a "cramping" sensation that feels slightly more severe than her normal periods. She reports associated vaginal bleeding like her menstrual cycle. She reports vomiting once 4 days ago. Pt recently had a miscarriage of a 6 week pregnancy on March 8th, 2018 and has not had a period since her pregnancy. She states that she is sexually active and does not use protection. She has been eating and drinking normally today. Denies any constipation, fevers, chills, coughing, trouble breathing, dysuria, difficulty urinating, lightheadedness or syncope.   History reviewed. No pertinent past medical history.  There are no active problems to display for this patient.   History reviewed. No pertinent surgical history.  OB History    Gravida Para Term Preterm AB Living   1             SAB TAB Ectopic Multiple Live Births                   Home Medications    Prior to Admission medications   Medication Sig Start Date End Date Taking? Authorizing Provider  naproxen (NAPROSYN) 250 MG tablet Take 1 tablet (250 mg total) by mouth 2 (two) times daily with a meal. 08/03/16   Everlene Farrieransie, Daylin Gruszka, PA-C    Family History History reviewed. No pertinent family history.  Social  History Social History  Substance Use Topics  . Smoking status: Former Games developermoker  . Smokeless tobacco: Never Used  . Alcohol use No     Allergies   Patient has no known allergies.   Review of Systems Review of Systems  Constitutional: Negative for chills and fever.  HENT: Negative for congestion and sore throat.   Eyes: Negative for visual disturbance.  Respiratory: Negative for cough and shortness of breath.   Cardiovascular: Negative for chest pain.  Gastrointestinal: Positive for vomiting (resolved ). Negative for abdominal pain, constipation, diarrhea and nausea.  Genitourinary: Positive for pelvic pain and vaginal bleeding. Negative for difficulty urinating, dysuria, flank pain, frequency, hematuria, urgency, vaginal discharge and vaginal pain.  Musculoskeletal: Negative for back pain and neck pain.  Skin: Negative for rash.  Neurological: Negative for syncope, light-headedness and headaches.     Physical Exam Updated Vital Signs BP 127/71 (BP Location: Right Arm)   Pulse 62   Temp 98.5 F (36.9 C) (Oral)   Resp 18   Ht 5\' 5"  (1.651 m)   Wt 79.4 kg   LMP 08/03/2016   SpO2 100%   Breastfeeding? Unknown   BMI 29.12 kg/m   Physical Exam  Constitutional: She appears well-developed and well-nourished. No distress.  Nontoxic appearing.  HENT:  Head: Normocephalic and atraumatic.  Mouth/Throat: Oropharynx is clear and moist.  Eyes: Conjunctivae are normal. Pupils are equal, round, and reactive to light. Right eye exhibits  no discharge. Left eye exhibits no discharge.  Neck: Neck supple.  Cardiovascular: Normal rate, regular rhythm, normal heart sounds and intact distal pulses.  Exam reveals no gallop and no friction rub.   No murmur heard. Pulmonary/Chest: Effort normal and breath sounds normal. No respiratory distress. She has no wheezes. She has no rales.  Abdominal: Soft. Bowel sounds are normal. She exhibits no distension and no mass. There is no tenderness. There  is no guarding.  Abdomen is soft and nontender to palpation. No CVA or flank tenderness. No peritoneal signs. No psoas or obturator signs.   Genitourinary:  Genitourinary Comments: Pelvic exam with female RN chaperone. No external lesions or rashes noted. Patient has a mild amount of blood in her vaginal vault. Cervix is closed. No cervical motion tenderness. No adnexal tenderness or fullness.  Musculoskeletal: She exhibits no edema.  Lymphadenopathy:    She has no cervical adenopathy.  Neurological: She is alert. Coordination normal.  Skin: Skin is warm and dry. Capillary refill takes less than 2 seconds. No rash noted. She is not diaphoretic. No erythema. No pallor.  Psychiatric: She has a normal mood and affect. Her behavior is normal.  Nursing note and vitals reviewed.  ED Treatments / Results  DIAGNOSTIC STUDIES:  Oxygen Saturation is 100% on RA, normal by my interpretation.    COORDINATION OF CARE:  6:18 PM Discussed treatment plan with pt at bedside which includes pelvic exam and pregnancy test and pt agreed to plan.  Labs (all labs ordered are listed, but only abnormal results are displayed) Labs Reviewed  WET PREP, GENITAL - Abnormal; Notable for the following:       Result Value   WBC, Wet Prep HPF POC MODERATE (*)    All other components within normal limits  URINALYSIS, ROUTINE W REFLEX MICROSCOPIC - Abnormal; Notable for the following:    Hgb urine dipstick LARGE (*)    Leukocytes, UA SMALL (*)    All other components within normal limits  BASIC METABOLIC PANEL - Abnormal; Notable for the following:    Potassium 3.4 (*)    All other components within normal limits  CBC WITH DIFFERENTIAL/PLATELET - Abnormal; Notable for the following:    Hemoglobin 11.1 (*)    HCT 32.8 (*)    MCV 71.5 (*)    MCH 24.2 (*)    All other components within normal limits  URINALYSIS, MICROSCOPIC (REFLEX) - Abnormal; Notable for the following:    Bacteria, UA RARE (*)    Squamous  Epithelial / LPF 0-5 (*)    All other components within normal limits  PREGNANCY, URINE  GC/CHLAMYDIA PROBE AMP ([place]) NOT AT [facility]    EKG  EKG Interpretation None       Radiology No results found.  Procedures Procedures (including critical care time)  Medications Ordered in ED Medications  ibuprofen (ADVIL,MOTRIN) tablet 800 mg (800 mg Oral Given 08/03/16 1921)     Initial Impression / Assessment and Plan / ED Course  I have reviewed the triage vital signs and the nursing notes.  Pertinent labs & imaging results that were available during my care of the patient were reviewed by me and considered in my medical decision making (see chart for details).    This  is an otherwise healthy 21 y.o. female who presents to the Emergency Department complaining of constant, mild pelvic pain onset 5 days. Pt describes her pelvic pain as a "cramping" sensation that feels slightly more severe than her normal  periods. She reports associated vaginal bleeding like her menstrual cycle. She reports vomiting once 4 days ago. Pt recently had a miscarriage of a 6 week pregnancy on March 8th, 2018 and has not had a period since her pregnancy. She states that she is sexually active and does not use protection. On exam the patient is afebrile nontoxic appearing. Her abdomen is soft and nontender to palpation. On pelvic exam she has a small amount of blood in her vaginal vault. Cervix is closed. No cervical motion tenderness. No adnexal tenderness or fullness. CBC shows a hemoglobin of 11.1. This is around her baseline. BMP is unremarkable. Pregnancy test is negative. Urinalysis is nitrite negative with small leukocytes. Patient denies urinary symptoms. Wet prep shows only moderate white blood cells. I discussed the white blood cells can sometimes indicate an STD. Patient denies any vaginal discharge. She would like to wait for pending STD testing before being treated for any STDs today. I did encourage  her to follow-up with her OB/GYN in the health department for routine STD check. This appears to be menstrual cramping after recent miscarriage back in March. I advised the patient to follow-up with their primary care provider this week. I advised the patient to return to the emergency department with new or worsening symptoms or new concerns. The patient verbalized understanding and agreement with plan.     Final Clinical Impressions(s) / ED Diagnoses   Final diagnoses:  Vaginal bleeding  Menstrual cramps    New Prescriptions New Prescriptions   NAPROXEN (NAPROSYN) 250 MG TABLET    Take 1 tablet (250 mg total) by mouth 2 (two) times daily with a meal.   I personally performed the services described in this documentation, which was scribed in my presence. The recorded information has been reviewed and is accurate.      Everlene Farrier, PA-C 08/03/16 1955    Maia Plan, MD 08/04/16 [phone]

## 2016-08-03 NOTE — Discharge Instructions (Signed)
Please follow up on your STD results in about 3 days.

## 2016-08-03 NOTE — ED Triage Notes (Signed)
Patient states that she had a miscarriage in march. Reports that she now is having abominal cramping and bleeding and she is unsure if this her period or not

## 2016-08-04 LAB — GC/CHLAMYDIA PROBE AMP (~~LOC~~) NOT AT ARMC
Chlamydia: NEGATIVE
NEISSERIA GONORRHEA: NEGATIVE

## 2016-08-14 ENCOUNTER — Encounter (HOSPITAL_BASED_OUTPATIENT_CLINIC_OR_DEPARTMENT_OTHER): Payer: Self-pay | Admitting: Emergency Medicine

## 2016-08-14 ENCOUNTER — Emergency Department (HOSPITAL_BASED_OUTPATIENT_CLINIC_OR_DEPARTMENT_OTHER)
Admission: EM | Admit: 2016-08-14 | Discharge: 2016-08-14 | Disposition: A | Discharge status: Home / Self Care | Payer: Medicaid Other | Attending: Emergency Medicine | Admitting: Emergency Medicine

## 2016-08-14 DIAGNOSIS — N939 Abnormal uterine and vaginal bleeding, unspecified: Secondary | ICD-10-CM | POA: Insufficient documentation

## 2016-08-14 DIAGNOSIS — Z87891 Personal history of nicotine dependence: Secondary | ICD-10-CM | POA: Diagnosis not present

## 2016-08-14 DIAGNOSIS — R102 Pelvic and perineal pain: Secondary | ICD-10-CM

## 2016-08-14 DIAGNOSIS — Z8759 Personal history of other complications of pregnancy, childbirth and the puerperium: Secondary | ICD-10-CM | POA: Diagnosis not present

## 2016-08-14 HISTORY — DX: Complete or unspecified spontaneous abortion without complication: O03.9

## 2016-08-14 LAB — URINALYSIS, ROUTINE W REFLEX MICROSCOPIC
Bilirubin Urine: NEGATIVE
GLUCOSE, UA: NEGATIVE mg/dL
Ketones, ur: NEGATIVE mg/dL
NITRITE: NEGATIVE
PH: 7 (ref 5.0–8.0)
Protein, ur: NEGATIVE mg/dL
SPECIFIC GRAVITY, URINE: 1.02 (ref 1.005–1.030)

## 2016-08-14 LAB — URINALYSIS, MICROSCOPIC (REFLEX)

## 2016-08-14 LAB — PREGNANCY, URINE: Preg Test, Ur: NEGATIVE

## 2016-08-14 NOTE — Discharge Instructions (Signed)
Return for any new or worse symptoms. Keep your appointment to follow-up with GYN on Monday. Work note provided.   Urine culture was sent today and is pending. If abnormal you will be called.

## 2016-08-14 NOTE — ED Provider Notes (Signed)
[facility]   CSN: [id number] Arrival date & time: 08/14/16  1050     History   Chief Complaint Chief Complaint  Patient presents with  . Follow-up    HPI Jill Kelley is a 21 y.o. female.   Patient status post miscarriage back in March. Patient with some pelvic pain increased vaginal bleeding evaluation in May 7. Had pelvic and cultures done at that time without any specific findings. Patient's pregnancy test was negative then. It is negative today as well. Patient yesterday had some increased cramping and spotting. That has improved significantly today. Patient states no real vaginal bleeding now. Patient has follow-up with OB/GYN scheduled for Monday. No fevers. No nausea or vomiting.      Past Medical History:  Diagnosis Date  . Miscarriage     There are no active problems to display for this patient.   No past surgical history on file.  OB History    Gravida Para Term Preterm AB Living   1             SAB TAB Ectopic Multiple Live Births                   Home Medications    Prior to Admission medications   Medication Sig Start Date End Date Taking? Authorizing Provider  naproxen (NAPROSYN) 250 MG tablet Take 1 tablet (250 mg total) by mouth 2 (two) times daily with a meal. 08/03/16   Everlene Farrieransie, William, PA-C    Family History No family history on file.  Social History Social History  Substance Use Topics  . Smoking status: Former Games developermoker  . Smokeless tobacco: Never Used  . Alcohol use No     Allergies   Patient has no known allergies.   Review of Systems Review of Systems  Constitutional: Negative for fever.  HENT: Negative for congestion.   Eyes: Negative for redness.  Respiratory: Negative for shortness of breath.   Cardiovascular: Negative for chest pain.  Gastrointestinal: Positive for abdominal pain. Negative for nausea and vomiting.  Genitourinary: Positive for vaginal bleeding.  Musculoskeletal: Negative for  back pain.  Skin: Negative for rash.  Neurological: Negative for syncope and headaches.  Hematological: Does not bruise/bleed easily.  Psychiatric/Behavioral: Negative for confusion.     Physical Exam Updated Vital Signs BP 108/74   Pulse (!) 56   Temp 98.5 F (36.9 C) (Oral)   Resp 16   Ht 5\' 5"  (1.651 m)   Wt 174 lb (78.9 kg)   LMP 08/03/2016   SpO2 100%   BMI 28.96 kg/m   Physical Exam  Constitutional: She is oriented to person, place, and time. She appears well-developed and well-nourished. No distress.  HENT:  Head: Normocephalic and atraumatic.  Mouth/Throat: Oropharynx is clear and moist.  Eyes: Conjunctivae and EOM are normal. Pupils are equal, round, and reactive to light.  Neck: Normal range of motion. Neck supple.  Cardiovascular: Normal rate and regular rhythm.   Pulmonary/Chest: Effort normal and breath sounds normal.  Abdominal: Soft. Bowel sounds are normal. There is no tenderness.  Musculoskeletal: Normal range of motion.  Neurological: She is alert and oriented to person, place, and time. No cranial nerve deficit or sensory deficit. She exhibits normal muscle tone. Coordination normal.  Skin: Skin is warm.  Nursing note and vitals reviewed.    ED Treatments / Results  Labs (all labs ordered are listed, but only abnormal results are displayed) Labs Reviewed  URINALYSIS, ROUTINE W  REFLEX MICROSCOPIC - Abnormal; Notable for the following:       Result Value   APPearance CLOUDY (*)    Hgb urine dipstick MODERATE (*)    Leukocytes, UA MODERATE (*)    All other components within normal limits  URINALYSIS, MICROSCOPIC (REFLEX) - Abnormal; Notable for the following:    Bacteria, UA MANY (*)    Squamous Epithelial / LPF 6-30 (*)    All other components within normal limits  URINE CULTURE  PREGNANCY, URINE    EKG  EKG Interpretation None       Radiology No results found.  Procedures Procedures (including critical care time)  Medications  Ordered in ED Medications - No data to display   Initial Impression / Assessment and Plan / ED Course  I have reviewed the triage vital signs and the nursing notes.  Pertinent labs & imaging results that were available during my care of the patient were reviewed by me and considered in my medical decision making (see chart for details).     Patient with concern still having occasional pelvic cramping and some spotting. Patient status post miscarriage back in March. Patient had a thorough evaluation on May 7 for vaginal bleeding and pelvic pain. STD cultures were negative wet prep showed some white blood cells but nothing else. Patient started on Naprosyn. Patient has follow-up with OB/GYN on Monday. Patient states she's not having any heavy bleeding now. Some spotting yesterday. Had some increased cramping yesterday no  Significant abdominal pain today.  No fevers no nausea or vomiting. Patient's miscarriage was her first and also her first pregnancy.   Patient's urinalysis today suggestive of contamination but will send for urine culture. Patient made aware of this. She'll be notified if the urine culture is positive.   Patient nontoxic no acute distress. Follow-up with OB/GYN on Monday is important. See no reason for repeat pelvic ultrasound today.    Pregnancy test remains negative today. Also negative May 7.   Final Clinical Impressions(s) / ED Diagnoses   Final diagnoses:  Pelvic cramping    New Prescriptions New Prescriptions   No medications on file     Vanetta Mulders, MD 08/14/16 1350

## 2016-08-14 NOTE — ED Triage Notes (Signed)
Pt states yesterday she noticed some spotting and cramping.  Pt had miscarriage in march and was here for cramping in 08-03-16.  Pt to see OB/GYN on monday

## 2016-08-15 LAB — URINE CULTURE

## 2016-08-16 ENCOUNTER — Telehealth: Payer: Self-pay

## 2016-08-16 NOTE — Telephone Encounter (Signed)
No treatment for [facility] ED 08/14/16 per M. Loretha Brasilurner Pharm D

## 2017-05-06 IMAGING — US US OB TRANSVAGINAL
1 series · 14 of 28 positions shown · non-contrast
Comparison: None.

CLINICAL DATA: Vaginal bleeding in the first trimester pregnancy.

EXAM:
OBSTETRIC <14 WK US AND TRANSVAGINAL OB US
TECHNIQUE: Both transabdominal and transvaginal ultrasound examinations were
performed for complete evaluation of the gestation as well as the
maternal uterus, adnexal regions, and pelvic cul-de-sac.
Transvaginal technique was performed to assess early pregnancy.

[Series 1: us ob transvaginal · 0.18mm/px · 64 acquisitions, 14 frames shown]
[im 3/64]
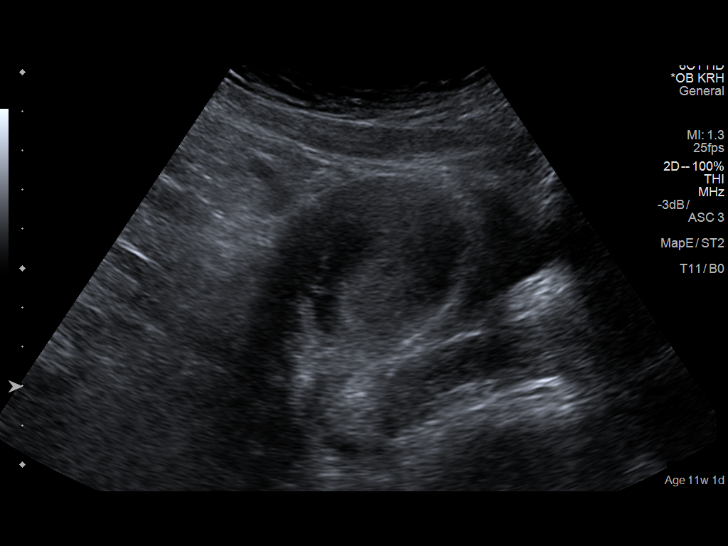
[im 8/64]
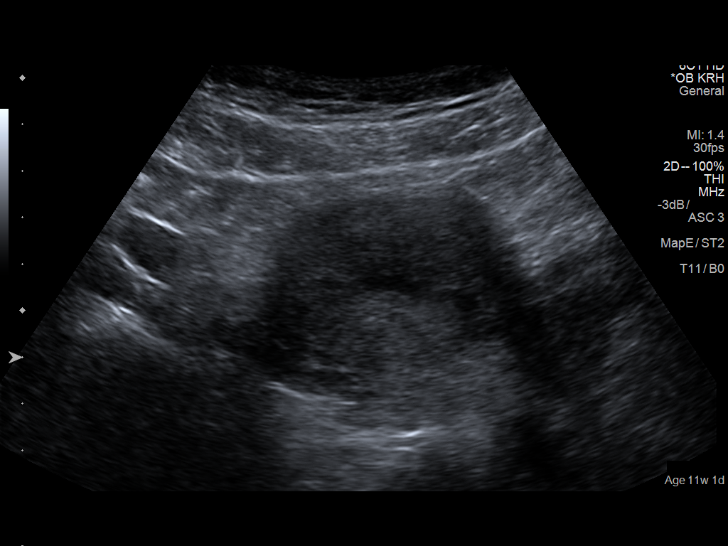
[im 12/64]
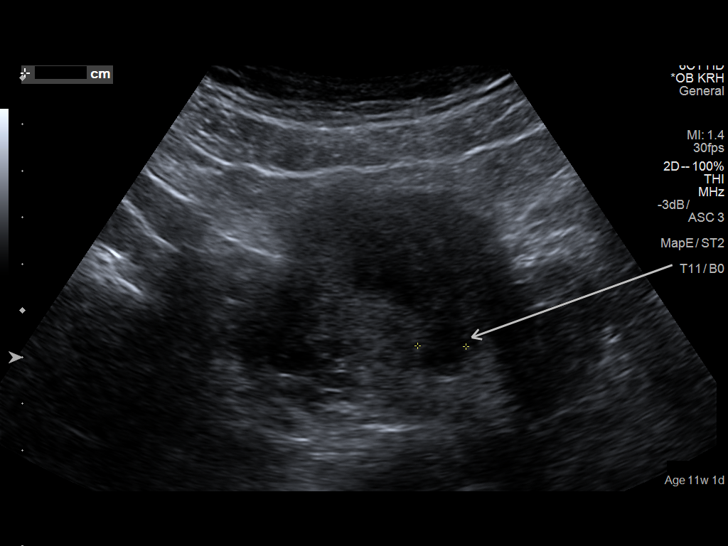
[im 17/64]
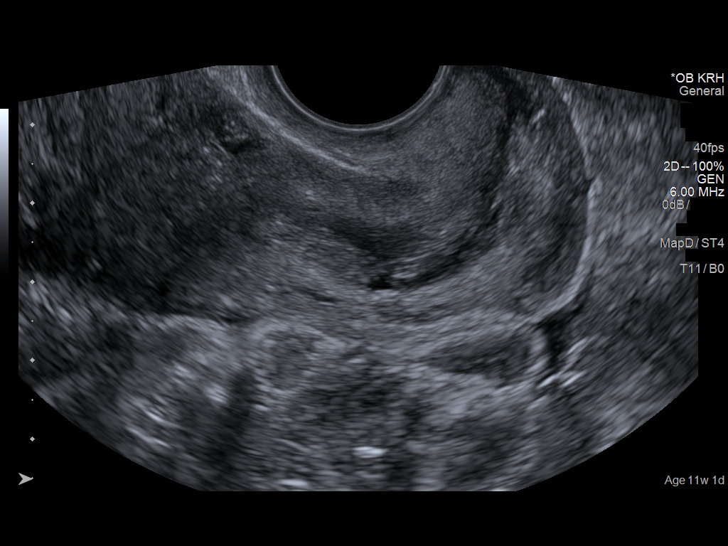
[im 22/64]
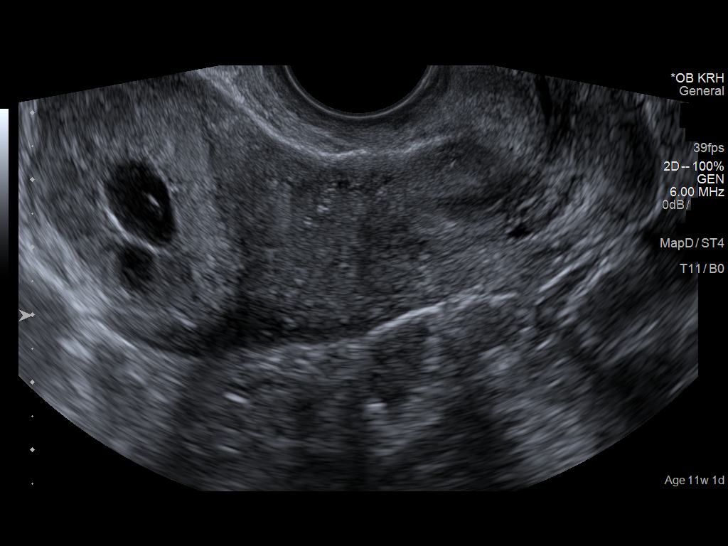
[im 26/64]
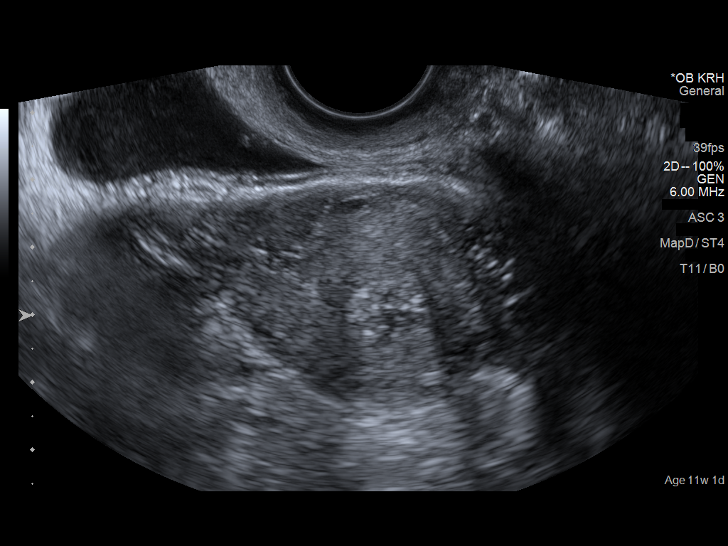
[im 31/64]
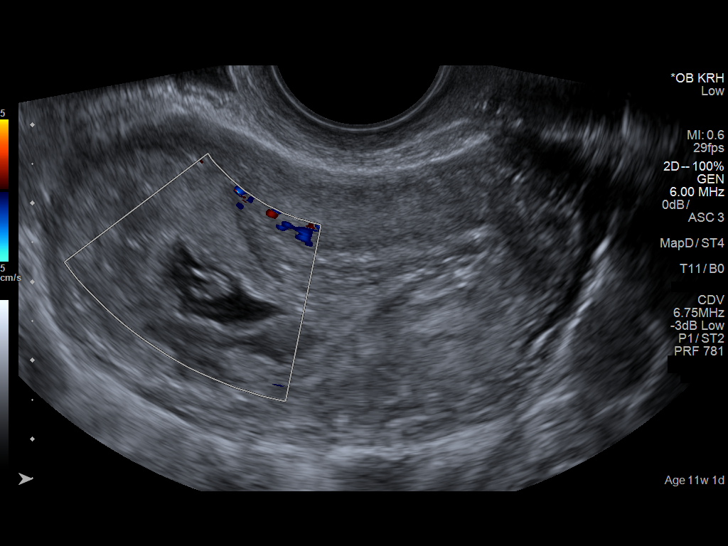
[im 36/64]
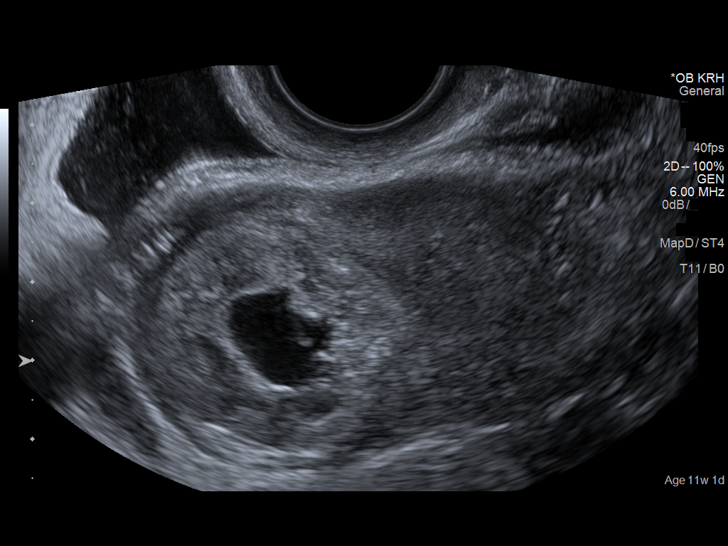
[im 40/64]
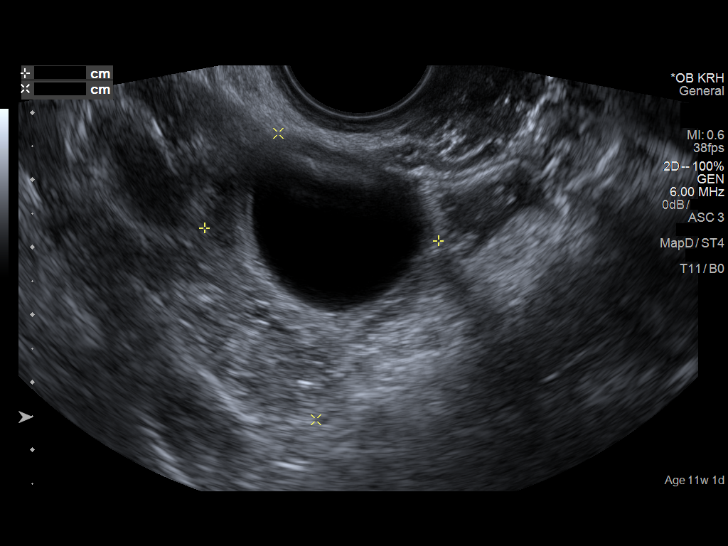
[im 45/64]
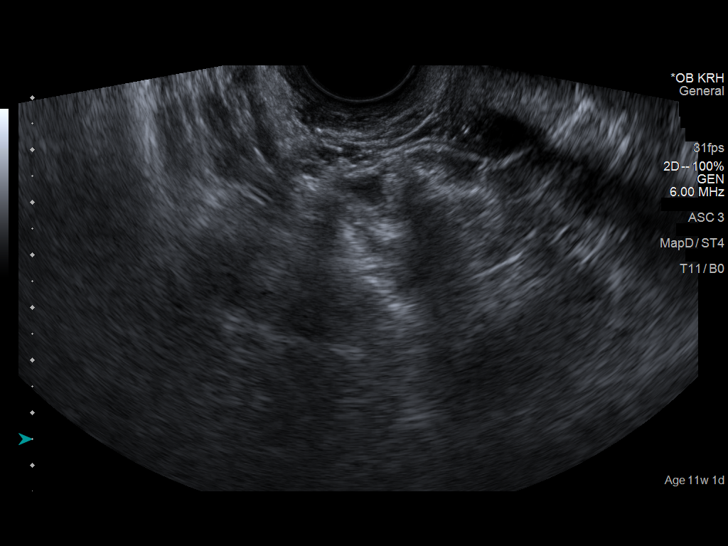
[im 50/64]
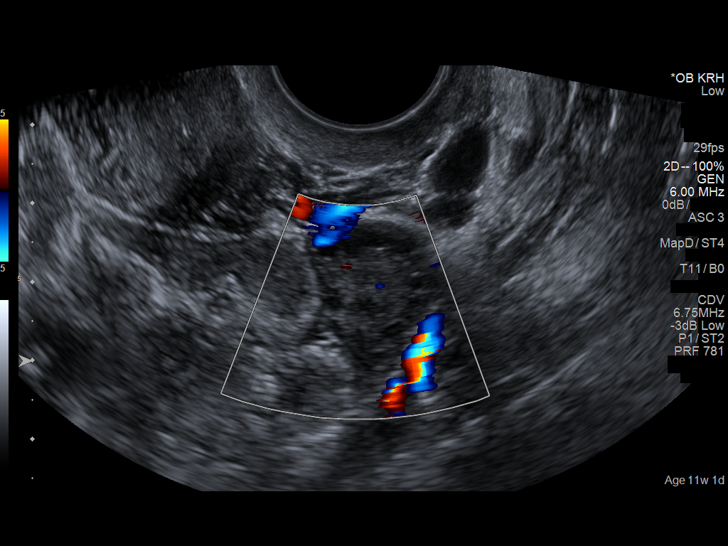
[im 54/64]
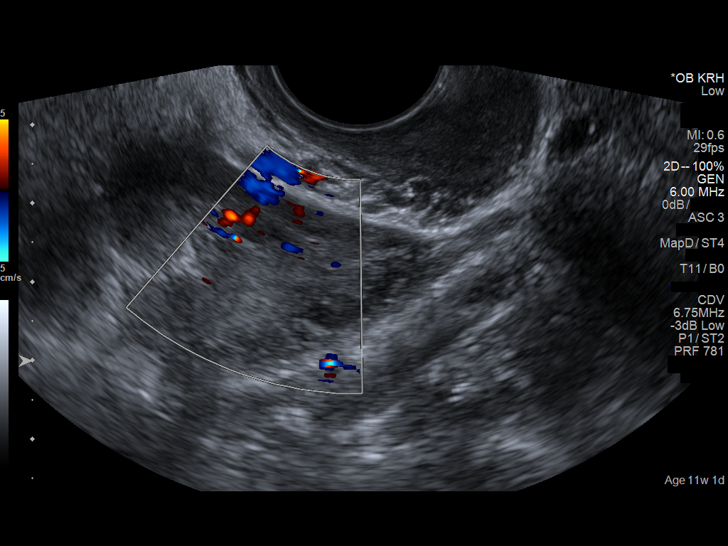
[im 59/64]
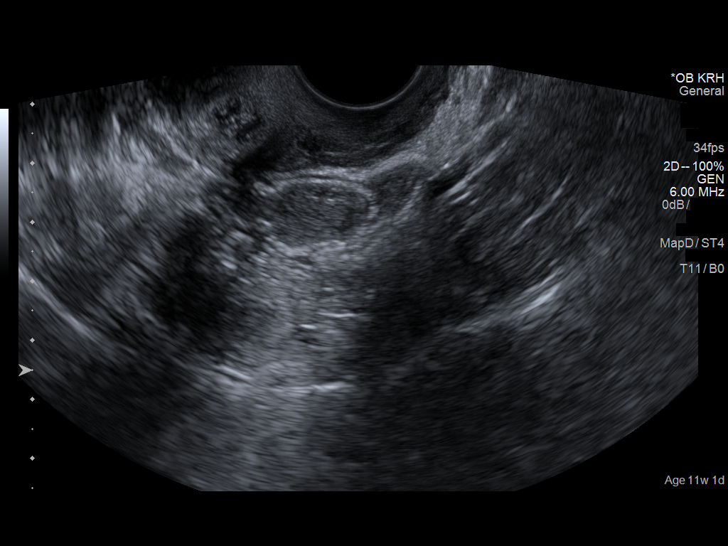
[im 64/64]
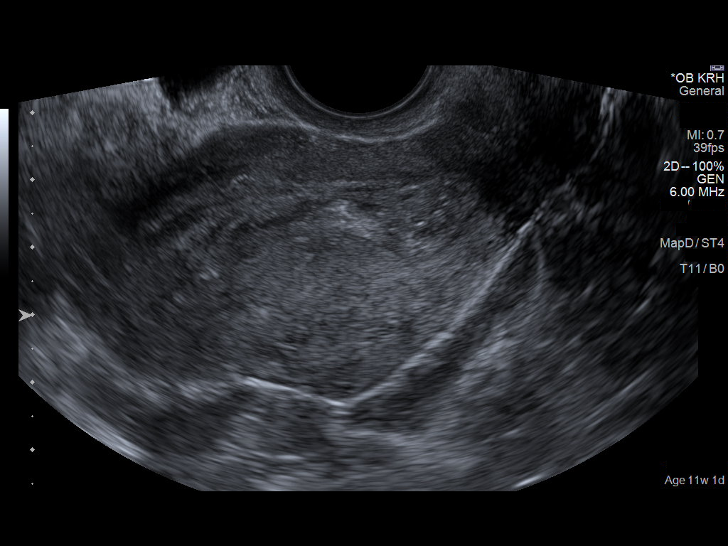

[14 of 28 positions shown; findings below may reference images not displayed]

FINDINGS: Intrauterine gestational sac: Single

Yolk sac:  Not Visualized.

Embryo:  Visualized.

Cardiac Activity: Not Visualized.

Heart Rate: 0  bpm

CRL:  6.7  mm   6 w 4 d                  US EDC: 01/21/2017

Subchorionic hemorrhage:  Moderate along the fundal portion.

Maternal uterus/adnexae: Corpus luteal cyst on the right measuring
2.9 cm in diameter.
IMPRESSION: Mishapened gestational sac without yolk sac and no heartbeat.
Moderate-sized perigestational hematoma. Findings are suspicious but
not yet definitive for failed pregnancy. Recommend follow-up US in
10-14 days for definitive diagnosis. This recommendation follows SRU
consensus guidelines: Diagnostic Criteria for Nonviable Pregnancy
Early in the First Trimester. N Engl J Med 0597; [DATE].

## 2018-10-25 ENCOUNTER — Emergency Department (HOSPITAL_BASED_OUTPATIENT_CLINIC_OR_DEPARTMENT_OTHER)
Admission: EM | Admit: 2018-10-25 | Discharge: 2018-10-25 | Disposition: A | Discharge status: Home / Self Care | Payer: 59 | Attending: Emergency Medicine | Admitting: Emergency Medicine

## 2018-10-25 ENCOUNTER — Other Ambulatory Visit: Payer: Self-pay

## 2018-10-25 ENCOUNTER — Encounter (HOSPITAL_BASED_OUTPATIENT_CLINIC_OR_DEPARTMENT_OTHER): Payer: Self-pay | Admitting: *Deleted

## 2018-10-25 DIAGNOSIS — F17210 Nicotine dependence, cigarettes, uncomplicated: Secondary | ICD-10-CM | POA: Diagnosis not present

## 2018-10-25 DIAGNOSIS — L02213 Cutaneous abscess of chest wall: Secondary | ICD-10-CM | POA: Insufficient documentation

## 2018-10-25 DIAGNOSIS — L0291 Cutaneous abscess, unspecified: Secondary | ICD-10-CM

## 2018-10-25 MED ORDER — SULFAMETHOXAZOLE-TRIMETHOPRIM 800-160 MG PO TABS
1.0000 | ORAL_TABLET | Freq: Once | ORAL | Status: AC
  Administered 2018-10-25: 1 via ORAL
  Filled 2018-10-25: qty 1

## 2018-10-25 MED ORDER — HYDROCODONE-ACETAMINOPHEN 5-325 MG PO TABS
1.0000 | ORAL_TABLET | Freq: Once | ORAL | Status: AC
  Administered 2018-10-25: 18:00:00 1 via ORAL
  Filled 2018-10-25: qty 1

## 2018-10-25 MED ORDER — PENTAFLUOROPROP-TETRAFLUOROETH EX AERO
INHALATION_SPRAY | CUTANEOUS | Status: DC | PRN
  Administered 2018-10-25: 30 via TOPICAL
  Filled 2018-10-25: qty 30

## 2018-10-25 MED ORDER — IBUPROFEN 800 MG PO TABS
800.0000 mg | ORAL_TABLET | Freq: Once | ORAL | Status: AC
  Administered 2018-10-25: 18:00:00 800 mg via ORAL
  Filled 2018-10-25: qty 1

## 2018-10-25 MED ORDER — LIDOCAINE-EPINEPHRINE (PF) 2 %-1:200000 IJ SOLN
10.0000 mL | Freq: Once | INTRAMUSCULAR | Status: AC
  Administered 2018-10-25: 10 mL
  Filled 2018-10-25 (×2): qty 10

## 2018-10-25 MED ORDER — SULFAMETHOXAZOLE-TRIMETHOPRIM 800-160 MG PO TABS: 1.0000 | ORAL_TABLET | Freq: Two times a day (BID) | ORAL | 0 refills | Status: AC

## 2018-10-25 MED ORDER — HYDROCODONE-ACETAMINOPHEN 5-325 MG PO TABS: 1.0000 | ORAL_TABLET | ORAL | 0 refills | Status: AC | PRN

## 2018-10-25 NOTE — ED Provider Notes (Signed)
[facility] EMERGENCY DEPARTMENT Provider Note   CSN: [id number] Arrival date & time: 10/25/18  1634    History   Chief Complaint Chief Complaint  Patient presents with  . Abscess    HPI Jill Kelley is a 23 y.o. female.     Pt presents to the ED today with an abscess under her left breast.  It has been there for 2 days.  She has had abscesses in the past that have had to be drained.      Past Medical History:  Diagnosis Date  . Miscarriage     There are no active problems to display for this patient.   History reviewed. No pertinent surgical history.   OB History    Gravida  1   Para      Term      Preterm      AB      Living        SAB      TAB      Ectopic      Multiple      Live Births               Home Medications    Prior to Admission medications   Medication Sig Start Date End Date Taking? Authorizing Provider  HYDROcodone-acetaminophen (NORCO/VICODIN) 5-325 MG tablet Take 1 tablet by mouth every 4 (four) hours as needed. 10/25/18   Isla Pence, MD  naproxen (NAPROSYN) 250 MG tablet Take 1 tablet (250 mg total) by mouth 2 (two) times daily with a meal. 08/03/16   Waynetta Pean, PA-C  sulfamethoxazole-trimethoprim (BACTRIM DS) 800-160 MG tablet Take 1 tablet by mouth 2 (two) times daily for 7 days. 10/25/18 11/01/18  Isla Pence, MD    Family History No family history on file.  Social History Social History   Tobacco Use  . Smoking status: Current Every Day Smoker  . Smokeless tobacco: Never Used  Substance Use Topics  . Alcohol use: No  . Drug use: No     Allergies   Patient has no known allergies.   Review of Systems Review of Systems  Skin:       abscess  All other systems reviewed and are negative.    Physical Exam Updated Vital Signs BP 111/78   Pulse 65   Temp 98.6 F (37 C) (Oral)   Resp 18   Ht 5\' 5"  (1.651 m)   Wt 79.8 kg   LMP 10/16/2018   SpO2 99%   BMI 29.29 kg/m    Physical Exam Vitals signs and nursing note reviewed. Exam conducted with a chaperone present.  Constitutional:      Appearance: Normal appearance.  HENT:     Head: Normocephalic and atraumatic.     Right Ear: External ear normal.     Left Ear: External ear normal.     Nose: Nose normal.     Mouth/Throat:     Mouth: Mucous membranes are moist.     Pharynx: Oropharynx is clear.  Eyes:     Extraocular Movements: Extraocular movements intact.     Conjunctiva/sclera: Conjunctivae normal.     Pupils: Pupils are equal, round, and reactive to light.  Neck:     Musculoskeletal: Normal range of motion and neck supple.  Cardiovascular:     Rate and Rhythm: Normal rate and regular rhythm.     Pulses: Normal pulses.     Heart sounds: Normal heart sounds.  Pulmonary:  Effort: Pulmonary effort is normal.     Breath sounds: Normal breath sounds.  Chest:       Comments: Abscess in area that is tender Abdominal:     General: Abdomen is flat. Bowel sounds are normal.     Palpations: Abdomen is soft.  Skin:    General: Skin is warm.     Capillary Refill: Capillary refill takes less than 2 seconds.  Neurological:     General: No focal deficit present.     Mental Status: She is alert and oriented to person, place, and time.  Psychiatric:        Mood and Affect: Mood normal.        Behavior: Behavior normal.        Thought Content: Thought content normal.        Judgment: Judgment normal.      ED Treatments / Results  Labs (all labs ordered are listed, but only abnormal results are displayed) Labs Reviewed - No data to display  EKG None  Radiology No results found.  Procedures .Marland Kitchen.Incision and Drainage  Date/Time: 10/25/2018 6:26 PM Performed by: Jacalyn LefevreHaviland, Hikaru Delorenzo, MD Authorized by: Jacalyn LefevreHaviland, Miloh Alcocer, MD   Consent:    Consent obtained:  Verbal   Consent given by:  Patient   Risks discussed:  Incomplete drainage and pain   Alternatives discussed:  No treatment Location:     Type:  Abscess   Size:  3   Location:  Trunk   Trunk location:  L breast Pre-procedure details:    Skin preparation:  Betadine Anesthesia (see MAR for exact dosages):    Anesthesia method:  Local infiltration   Local anesthetic:  Lidocaine 2% WITH epi Procedure type:    Complexity:  Simple Procedure details:    Incision types:  Elliptical   Scalpel blade:  11   Wound management:  Probed and deloculated   Drainage:  Purulent   Drainage amount:  Moderate   Wound treatment:  Wound left open   Packing materials:  None Post-procedure details:    Patient tolerance of procedure:  Tolerated well, no immediate complications   (including critical care time)  Medications Ordered in ED Medications  pentafluoroprop-tetrafluoroeth (GEBAUERS) aerosol (30 application Topical Given 10/25/18 1815)  HYDROcodone-acetaminophen (NORCO/VICODIN) 5-325 MG per tablet 1 tablet (1 tablet Oral Given 10/25/18 1814)  ibuprofen (ADVIL) tablet 800 mg (800 mg Oral Given 10/25/18 1814)  sulfamethoxazole-trimethoprim (BACTRIM DS) 800-160 MG per tablet 1 tablet (1 tablet Oral Given 10/25/18 1813)  lidocaine-EPINEPHrine (XYLOCAINE W/EPI) 2 %-1:200000 (PF) injection 10 mL (10 mLs Infiltration Given 10/25/18 1813)     Initial Impression / Assessment and Plan / ED Course  I have reviewed the triage vital signs and the nursing notes.  Pertinent labs & imaging results that were available during my care of the patient were reviewed by me and considered in my medical decision making (see chart for details).      Pt tolerated I&D well.  She is stable for d/c.  Return if worse.   Final Clinical Impressions(s) / ED Diagnoses   Final diagnoses:  Abscess    ED Discharge Orders         Ordered    sulfamethoxazole-trimethoprim (BACTRIM DS) 800-160 MG tablet  2 times daily     10/25/18 1839    HYDROcodone-acetaminophen (NORCO/VICODIN) 5-325 MG tablet  Every 4 hours PRN     10/25/18 1839           Jacalyn LefevreHaviland,  Mckensie Scotti, MD 10/25/18  1840  

## 2018-10-25 NOTE — ED Triage Notes (Signed)
Abscess under her left breast x 2 days.

## 2019-02-14 ENCOUNTER — Emergency Department (HOSPITAL_BASED_OUTPATIENT_CLINIC_OR_DEPARTMENT_OTHER)
Admission: EM | Admit: 2019-02-14 | Discharge: 2019-02-14 | Disposition: A | Discharge status: Home / Self Care | Payer: 59 | Attending: Emergency Medicine | Admitting: Emergency Medicine

## 2019-02-14 ENCOUNTER — Other Ambulatory Visit: Payer: Self-pay

## 2019-02-14 ENCOUNTER — Encounter (HOSPITAL_BASED_OUTPATIENT_CLINIC_OR_DEPARTMENT_OTHER): Payer: Self-pay | Admitting: *Deleted

## 2019-02-14 DIAGNOSIS — L723 Sebaceous cyst: Secondary | ICD-10-CM | POA: Insufficient documentation

## 2019-02-14 DIAGNOSIS — F17210 Nicotine dependence, cigarettes, uncomplicated: Secondary | ICD-10-CM | POA: Insufficient documentation

## 2019-02-14 DIAGNOSIS — L089 Local infection of the skin and subcutaneous tissue, unspecified: Secondary | ICD-10-CM

## 2019-02-14 MED ORDER — LIDOCAINE HCL (PF) 1 % IJ SOLN
5.0000 mL | Freq: Once | INTRAMUSCULAR | Status: AC
  Administered 2019-02-14: 5 mL
  Filled 2019-02-14: qty 5

## 2019-02-14 MED ORDER — LIDOCAINE 4 % EX CREA
TOPICAL_CREAM | Freq: Once | CUTANEOUS | Status: AC
  Administered 2019-02-14: 1 via TOPICAL
  Filled 2019-02-14: qty 5

## 2019-02-14 MED ORDER — PROBIOTIC 250 MG PO CAPS: 1.0000 | ORAL_CAPSULE | Freq: Every day | ORAL | 0 refills | Status: AC

## 2019-02-14 MED ORDER — CLINDAMYCIN HCL 150 MG PO CAPS: 450.0000 mg | ORAL_CAPSULE | Freq: Three times a day (TID) | ORAL | 0 refills | Status: AC

## 2019-02-14 MED ORDER — LIDOCAINE-PRILOCAINE 2.5-2.5 % EX CREA
TOPICAL_CREAM | Freq: Once | CUTANEOUS | Status: DC
  Filled 2019-02-14: qty 5

## 2019-02-14 NOTE — Discharge Instructions (Addendum)
Take clindamycin as prescribed until completed.  Take probiotic or eating yogurt daily while taking this medication.  Please return in 2 days for wound recheck.  Please follow-up with one of the dermatologist for further evaluation and treatment of your ongoing sebaceous cyst.  Please return to the emergency department sooner if you develop any increasing pain, redness, swelling, red streaking from the area, or fevers.

## 2019-02-14 NOTE — ED Provider Notes (Addendum)
[facility] EMERGENCY DEPARTMENT Provider Note   CSN: [id number] Arrival date & time: 02/14/19  1435     History   Chief Complaint Chief Complaint  Patient presents with  . Abscess    HPI Jill Kelley is a 23 y.o. female with history of abscess who presents with a 4-day history of abscess to the right side of her face.  She reports tenderness and redness.  She reports she has had this before coming and going starting like a pimple, however is gotten much bigger over the last few days.  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  Patient reports having abscesses elsewhere incised and drained in the past.  Patient has been trying warm compresses at home.  She denies any fevers or other symptoms.     HPI  Past Medical History:  Diagnosis Date  . Miscarriage     There are no active problems to display for this patient.   History reviewed. No pertinent surgical history.   OB History    Gravida  1   Para      Term      Preterm      AB      Living        SAB      TAB      Ectopic      Multiple      Live Births               Home Medications    Prior to Admission medications   Medication Sig Start Date End Date Taking? Authorizing Provider  clindamycin (CLEOCIN) 150 MG capsule Take 3 capsules (450 mg total) by mouth 3 (three) times daily for 7 days. 02/14/19 02/21/19  Emi HolesLaw, Braelee Herrle M, PA-C  HYDROcodone-acetaminophen (NORCO/VICODIN) 5-325 MG tablet Take 1 tablet by mouth every 4 (four) hours as needed. 10/25/18   Jacalyn LefevreHaviland, Julie, MD  naproxen (NAPROSYN) 250 MG tablet Take 1 tablet (250 mg total) by mouth 2 (two) times daily with a meal. 08/03/16   Everlene Farrieransie, William, PA-C  Saccharomyces boulardii (PROBIOTIC) 250 MG CAPS Take 1 capsule by mouth daily. 02/14/19   Emi HolesLaw, Achille Xiang M, PA-C    Family History No family history on file.  Social History Social History   Tobacco Use  . Smoking status: Current Every Day Smoker  .  Smokeless tobacco: Never Used  Substance Use Topics  . Alcohol use: No  . Drug use: No     Allergies   Patient has no known allergies.   Review of Systems Review of Systems  Constitutional: Negative for chills and fever.  HENT: Negative for facial swelling and sore throat.   Respiratory: Negative for shortness of breath.   Cardiovascular: Negative for chest pain.  Gastrointestinal: Negative for abdominal pain, nausea and vomiting.  Genitourinary: Negative for dysuria.  Musculoskeletal: Negative for back pain.  Skin: Positive for wound. Negative for rash.  Neurological: Negative for headaches.  Psychiatric/Behavioral: The patient is not nervous/anxious.      Physical Exam Updated Vital Signs BP 118/71   Pulse 89   Temp 98.9 F (37.2 C) (Oral)   Resp 20   Ht 5' 5.5" (1.664 m)   Wt 79.8 kg   LMP 01/31/2019   SpO2 100%   BMI 28.83 kg/m   Physical Exam Vitals signs and nursing note reviewed.  Constitutional:      General: She is not in acute distress.    Appearance: She  is well-developed. She is not diaphoretic.  HENT:     Head: Normocephalic and atraumatic.      Mouth/Throat:     Pharynx: No oropharyngeal exudate.  Eyes:     General: No scleral icterus.       Right eye: No discharge.        Left eye: No discharge.     Extraocular Movements: Extraocular movements intact.     Conjunctiva/sclera: Conjunctivae normal.     Pupils: Pupils are equal, round, and reactive to light.     Comments: No pain or difficulty with EOMs  Neck:     Musculoskeletal: Normal range of motion and neck supple.     Thyroid: No thyromegaly.  Cardiovascular:     Rate and Rhythm: Normal rate and regular rhythm.     Heart sounds: Normal heart sounds. No murmur. No friction rub. No gallop.   Pulmonary:     Effort: Pulmonary effort is normal. No respiratory distress.     Breath sounds: Normal breath sounds. No stridor. No wheezing or rales.  Abdominal:     General: Bowel sounds are  normal. There is no distension.     Palpations: Abdomen is soft.     Tenderness: There is no abdominal tenderness. There is no guarding or rebound.  Lymphadenopathy:     Cervical: No cervical adenopathy.  Skin:    General: Skin is warm and dry.     Coloration: Skin is not pale.     Findings: No rash.  Neurological:     Mental Status: She is alert.     Coordination: Coordination normal.      ED Treatments / Results  Labs (all labs ordered are listed, but only abnormal results are displayed) Labs Reviewed - No data to display  EKG None  Radiology No results found.  Procedures Ultrasound ED Soft Tissue  Date/Time: 02/14/2019 3:00 PM Performed by: Emi Holes, PA-C Authorized by: Emi Holes, PA-C   Procedure details:    Indications: localization of abscess and evaluate for cellulitis     Transverse view:  Visualized   Longitudinal view:  Visualized   Images: archived   Location:    Location: face     Side:  Right Findings:     abscess present (possible infected sebaceous cyst)    no cellulitis present .Marland KitchenIncision and Drainage  Date/Time: 02/14/2019 4:39 PM Performed by: Emi Holes, PA-C Authorized by: Emi Holes, PA-C   Consent:    Consent obtained:  Verbal   Consent given by:  Patient   Risks discussed:  Bleeding, incomplete drainage, pain, damage to other organs and infection   Alternatives discussed:  Alternative treatment Location:    Type:  Abscess   Size:  2.5   Location:  Head   Head location:  Face Pre-procedure details:    Skin preparation:  Chloraprep Anesthesia (see MAR for exact dosages):    Anesthesia method:  Topical application and local infiltration   Topical anesthesia: LMX.   Local anesthetic:  Lidocaine 1% w/o epi Procedure type:    Complexity:  Simple Procedure details:    Needle aspiration: no     Incision types:  Stab incision   Incision depth:  Dermal   Scalpel blade:  11   Wound management:  Irrigated  with saline   Drainage:  Purulent and bloody (sebaceous)   Drainage amount:  Copious   Wound treatment:  Wound left open Post-procedure details:    Patient tolerance of  procedure:  Tolerated well, no immediate complications Comments:     Procedure performed by PA-S, Roxan Hockey, under my supervision   (including critical care time)  Medications Ordered in ED Medications  lidocaine (LMX) 4 % cream (1 application Topical Given 02/14/19 1510)  lidocaine (PF) (XYLOCAINE) 1 % injection 5 mL (5 mLs Infiltration Given 02/14/19 1604)     Initial Impression / Assessment and Plan / ED Course  I have reviewed the triage vital signs and the nursing notes.  Pertinent labs & imaging results that were available during my care of the patient were reviewed by me and considered in my medical decision making (see chart for details).        Patient presenting with infected sebaceous cyst.  Patient reports she gets them regularly.  Incision and drainage as above with purulent and white sebaceous material.  Will treat with clindamycin for surrounding erythema and remaining induration  Return in 2 days for wound check.  Given dermatology for follow-up.  Return precautions discussed.  Patient understands and agrees with plan.  Patient vitals stable throughout ED course and discharged in satisfactory condition.  Final Clinical Impressions(s) / ED Diagnoses   Final diagnoses:  Infected sebaceous cyst    ED Discharge Orders         Ordered    clindamycin (CLEOCIN) 150 MG capsule  3 times daily     02/14/19 1637    Saccharomyces boulardii (PROBIOTIC) 250 MG CAPS  Daily     02/14/19 1637           Frederica Kuster, PA-C 02/14/19 1642    Hayden Rasmussen, MD 02/14/19 1941    Frederica Kuster, PA-C 02/14/19 1947    Hayden Rasmussen, MD 02/15/19 [phone]

## 2019-02-14 NOTE — ED Triage Notes (Signed)
Abscess to the right side of her face x 4 days.

## 2019-08-24 ENCOUNTER — Other Ambulatory Visit: Payer: Self-pay

## 2019-08-24 ENCOUNTER — Emergency Department (HOSPITAL_BASED_OUTPATIENT_CLINIC_OR_DEPARTMENT_OTHER)
Admission: EM | Admit: 2019-08-24 | Discharge: 2019-08-24 | Disposition: A | Discharge status: Home / Self Care | Payer: Self-pay | Attending: Emergency Medicine | Admitting: Emergency Medicine

## 2019-08-24 ENCOUNTER — Encounter (HOSPITAL_BASED_OUTPATIENT_CLINIC_OR_DEPARTMENT_OTHER): Payer: Self-pay | Admitting: Emergency Medicine

## 2019-08-24 DIAGNOSIS — F17210 Nicotine dependence, cigarettes, uncomplicated: Secondary | ICD-10-CM | POA: Insufficient documentation

## 2019-08-24 DIAGNOSIS — R197 Diarrhea, unspecified: Secondary | ICD-10-CM | POA: Insufficient documentation

## 2019-08-24 MED ORDER — ONDANSETRON 4 MG PO TBDP: 4.0000 mg | ORAL_TABLET | Freq: Once | ORAL | Status: DC

## 2019-08-24 MED ORDER — SODIUM CHLORIDE 0.9 % IV BOLUS: 500.0000 mL | Freq: Once | INTRAVENOUS | Status: DC

## 2019-08-24 MED ORDER — ONDANSETRON 4 MG PO TBDP: 4.0000 mg | ORAL_TABLET | Freq: Three times a day (TID) | ORAL | 0 refills | Status: DC | PRN

## 2019-08-24 NOTE — ED Triage Notes (Signed)
Diarrhea x4 days.  Abdominal pain while she is going.  Vomited earlier in the week.

## 2019-08-24 NOTE — ED Provider Notes (Signed)
[facility] EMERGENCY DEPARTMENT Provider Note   CSN: [id number] Arrival date & time: 08/24/19  1003     History Chief Complaint  Patient presents with  . Diarrhea    Jill Kelley is a 24 y.o. female otherwise healthy no daily medication use presents today for diarrhea.  Patient reports that on Monday, 08/21/2019 she began experiencing mild nausea with one episode of nonbloody/nonbilious emesis.  Shortly after she noticed that her abdomen started to feel "watery" a mild cramping sensation moving about her abdomen shortly followed by an episode of nonbloody diarrhea.  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  Patient reports that since Monday she has had around one episode of nonbloody/nonbilious emesis per day along with multiple episodes of nonbloody diarrhea.  She reports that she only has abdominal pain just before she has diarrhea and she reports as a minimal crampy watery sensation that completely resolves after she stops her bowel movement.  She denies fever/chills, neck stiffness, sore throat, chest pain/shortness of breath, cough/hemoptysis, dysuria/hematuria, vaginal bleeding/discharge, concern for STI or any additional concerns.  Of note patient reports that she has been attempting to stay hydrated drinking an increased amount of water and sports drinks every day.  HPI     Past Medical History:  Diagnosis Date  . Miscarriage     There are no problems to display for this patient.   Past Surgical History:  Procedure Laterality Date  . DILATION AND CURETTAGE OF UTERUS       OB History    Gravida  1   Para      Term      Preterm      AB      Living        SAB      TAB      Ectopic      Multiple      Live Births              No family history on file.  Social History   Tobacco Use  . Smoking  status: Current Every Day Smoker    Packs/day: 0.20    Types: Cigarettes  . Smokeless tobacco: Never Used  Substance Use Topics  . Alcohol use: No  . Drug use: No    Home Medications Prior to Admission medications   Medication Sig Start Date End Date Taking? Authorizing Provider  HYDROcodone-acetaminophen (NORCO/VICODIN) 5-325 MG tablet Take 1 tablet by mouth every 4 (four) hours as needed. 10/25/18   Jacalyn Lefevre, MD  naproxen (NAPROSYN) 250 MG tablet Take 1 tablet (250 mg total) by mouth 2 (two) times daily with a meal. 08/03/16   Everlene Farrier, PA-C  ondansetron (ZOFRAN ODT) 4 MG disintegrating tablet Take 1 tablet (4 mg total) by mouth every 8 (eight) hours as needed for nausea or vomiting. 08/24/19   Bill Salinas, PA-C  Saccharomyces boulardii (PROBIOTIC) 250 MG CAPS Take 1 capsule by mouth daily. 02/14/19   Emi Holes, PA-C    Allergies    Patient has no known allergies.  Review of Systems   Review of Systems ten systems are reviewed and are negative for acute change except as noted in the HPI  Physical Exam Updated Vital Signs BP 116/80 (BP Location: Left Arm)   Pulse 78   Temp 98.5 F (36.9 C) (Oral)   Resp  16   Ht 5\' 5"  (1.651 m)   Wt 79.8 kg   LMP 07/25/2019   SpO2 100%   BMI 29.29 kg/m   Physical Exam Constitutional:      General: She is not in acute distress.    Appearance: Normal appearance. She is well-developed. She is not ill-appearing or diaphoretic.  HENT:     Head: Normocephalic and atraumatic.     Right Ear: External ear normal.     Left Ear: External ear normal.     Nose: Nose normal.  Eyes:     General: Vision grossly intact. Gaze aligned appropriately.     Pupils: Pupils are equal, round, and reactive to light.  Neck:     Trachea: Trachea and phonation normal. No tracheal deviation.  Pulmonary:     Effort: Pulmonary effort is normal. No respiratory distress.  Abdominal:     General: There is no distension.     Palpations:  Abdomen is soft.     Tenderness: There is no abdominal tenderness. There is no guarding or rebound.  Genitourinary:    Comments: Refused by patient Musculoskeletal:        General: Normal range of motion.     Cervical back: Normal range of motion.  Skin:    General: Skin is warm and dry.  Neurological:     Mental Status: She is alert.     GCS: GCS eye subscore is 4. GCS verbal subscore is 5. GCS motor subscore is 6.     Comments: Speech is clear and goal oriented, follows commands Major Cranial nerves without deficit, no facial droop Moves extremities without ataxia, coordination intact  Psychiatric:        Behavior: Behavior normal.     ED Results / Procedures / Treatments   Labs (all labs ordered are listed, but only abnormal results are displayed) Labs Reviewed - No data to display  EKG None  Radiology No results found.  Procedures Procedures (including critical care time)  Medications Ordered in ED Medications - No data to display  ED Course  I have reviewed the triage vital signs and the nursing notes.  Pertinent labs & imaging results that were available during my care of the patient were reviewed by me and considered in my medical decision making (see chart for details).    MDM Rules/Calculators/A&P                     Additional History Obtained: 1. Nursing notes from this visit.  24 year old female arrives with a 4-day episode of intermittent nausea with nonbloody/nonbilious emesis, intermittent nonbloody diarrhea and mild abdominal cramping only during her episodes of diarrhea.  She is well-appearing no acute distress, nontoxic appearing.  She has normal vital signs on arrival.  Examination is reassuring, cranial nerves intact, no meningeal signs, airway clear, abdomen soft nontender without peritoneal signs, she denies any vaginal or urinary symptoms and has no additional concerns.  She reports that she has multiple contacts who are experiencing similar  illnesses, she believes that she has "a stomach bug".  Plan of care is to obtain screening labs including CBC, CMP, lipase, urinalysis and pregnancy test.  Will give patient a small fluid bolus and IV Zofran and reassess.  She has no abdominal tenderness or pain during examination doubt appendicitis, cholecystitis or other emergent processes at this time requiring CT imaging.  Will reassess patient after labs return. 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  I informed patient that I would like to obtain screening labs to assess for dehydration or elevation of WBCs or LFTs among other abnormalities and she refused.  She reports that she is having no pain at this time and is feeling well and would like to leave.  Patient is fully alert and oriented and has the mental capacity make her own medical decisions.  I informed patient of risks of leaving prior to obtaining screening blood work and she stated understanding.  On reassessment there is no abdominal pain or tenderness, doubt any emergent process requiring to hold patient here against her will.  Suspect patient has viral gastroenteritis as cause of her symptoms at this time, she does not appear clinically dehydrated.  Will prescribe patient short course of Zofran and encourage return to the emergency department if she has any new or worsening symptoms.  Patient states understanding and plans to return to the emergency department if she begins feeling worse.  At this time there does not appear to be any evidence of an acute emergency medical condition and the patient appears stable for discharge with appropriate outpatient follow up. Diagnosis was discussed with patient who verbalizes understanding of care plan and is agreeable to discharge. I have discussed return precautions with patient who verbalizes understanding. Patient encouraged to follow-up  with their PCP. All questions answered.  Note: Portions of this report may have been transcribed using voice recognition software. Every effort was made to ensure accuracy; however, inadvertent computerized transcription errors may still be present. Final Clinical Impression(s) / ED Diagnoses Final diagnoses:  Diarrhea, unspecified type    Rx / DC Orders ED Discharge Orders         Ordered    ondansetron (ZOFRAN ODT) 4 MG disintegrating tablet  Every 8 hours PRN     08/24/19 1107           Bill Salinas, PA-C 08/24/19 1116    Virgina Norfolk, DO 08/24/19 1438

## 2019-08-24 NOTE — ED Notes (Signed)
Pt states unable to stay to complete workup, needs to get home.  Provider at bedside, spoke to pt about prescriptions and symptom treatment and to return if worsening abdominal pain. Pt verbalizes good understanding and will return if needed.

## 2019-08-24 NOTE — Discharge Instructions (Addendum)
Please return to the Emergency Department immediately for any new or worsening symptoms. Please be sure to follow up with your Primary Care Provider within one week regarding your visit today; please call their office to schedule an appointment even if you are feeling better for a follow-up visit. You may use the nausea medication Zofran as prescribed to help with your symptoms.  Return to the emergency department at anytime for completion of your work-up.  Get help right away if: You have chest pain. You feel very weak or you pass out (faint). You have bloody or black poop or poop that looks like tar. You have very bad pain, cramping, or bloating in your belly (abdomen). You have trouble breathing or you are breathing very quickly. Your heart is beating very quickly. Your skin feels cold and clammy. You feel confused. You have signs of losing too much water in your body, such as: Dark pee, very little pee, or no pee. Cracked lips. Dry mouth. Sunken eyes. Sleepiness. Weakness. You have any new/concerning or worsening of symptoms  Please read the additional information packets attached to your discharge summary.  Do not take your medicine if  develop an itchy rash, swelling in your mouth or lips, or difficulty breathing; call 911 and seek immediate emergency medical attention if this occurs.  Note: Portions of this text may have been transcribed using voice recognition software. Every effort was made to ensure accuracy; however, inadvertent computerized transcription errors may still be present.

## 2019-08-28 ENCOUNTER — Encounter (HOSPITAL_BASED_OUTPATIENT_CLINIC_OR_DEPARTMENT_OTHER): Payer: Self-pay | Admitting: Emergency Medicine

## 2019-08-28 ENCOUNTER — Emergency Department (HOSPITAL_BASED_OUTPATIENT_CLINIC_OR_DEPARTMENT_OTHER)
Admission: EM | Admit: 2019-08-28 | Discharge: 2019-08-28 | Disposition: A | Discharge status: Home / Self Care | Payer: Self-pay | Attending: Emergency Medicine | Admitting: Emergency Medicine

## 2019-08-28 ENCOUNTER — Other Ambulatory Visit: Payer: Self-pay

## 2019-08-28 DIAGNOSIS — R1084 Generalized abdominal pain: Secondary | ICD-10-CM | POA: Insufficient documentation

## 2019-08-28 DIAGNOSIS — F17210 Nicotine dependence, cigarettes, uncomplicated: Secondary | ICD-10-CM | POA: Insufficient documentation

## 2019-08-28 DIAGNOSIS — R110 Nausea: Secondary | ICD-10-CM

## 2019-08-28 DIAGNOSIS — R197 Diarrhea, unspecified: Secondary | ICD-10-CM | POA: Insufficient documentation

## 2019-08-28 DIAGNOSIS — Z79899 Other long term (current) drug therapy: Secondary | ICD-10-CM | POA: Insufficient documentation

## 2019-08-28 MED ORDER — ONDANSETRON 4 MG PO TBDP: 4.0000 mg | ORAL_TABLET | Freq: Three times a day (TID) | ORAL | 0 refills | Status: AC | PRN

## 2019-08-28 NOTE — ED Notes (Signed)
Patient stated that she is no longer vomiting but has been having loose watery stools x 2 in the last 2 days.  She did have a prescription for Zofran and [facility] misplaced it.  She also stated that she wants a work note.

## 2019-08-28 NOTE — Discharge Instructions (Addendum)
Continue staying well-hydrated. Use Zofran as needed for nausea or vomiting. There is information in the paperwork about foods that may help her worsen diarrhea, read through this and be careful with your diet until your symptoms completely resolve. Return to the emergency room if you develop fevers, worsening vomiting, severe worsening pain, blood in your stool, or any new, worsening, or concerning symptoms.

## 2019-08-28 NOTE — ED Notes (Signed)
ED Provider at bedside. 

## 2019-08-28 NOTE — ED Triage Notes (Signed)
Here on the 27th with stomach virus, was told to come back today if not resolved, c/o nausea, vomiting and diarrhea, last vomiting last pm

## 2019-08-28 NOTE — ED Provider Notes (Signed)
[facility] EMERGENCY DEPARTMENT Provider Note   CSN: [id number] Arrival date & time: 08/28/19  2500     History Chief Complaint  Patient presents with  . Emesis  . Nausea    Jill Kelley is a 24 y.o. female presenting for evaluation of continued diarrhea.  Patient states her symptoms began 4 days ago.  Initially she was having nausea, vomiting, diarrhea, abdominal cramping.  She continues to have diarrhea, though is improving.  She is having 2-3 loose bowel movements a day.  No blood in her stool.  Originally she was having more bowel movements and it was more watery.  She reports some residual nausea, but no further vomiting.  She denies fevers or chills.  Abdominal pain has resolved.  She is here today because she was told she was not allowed to return to work and needs a work note.  She also was unable to fill her Zofran last time, is requesting a refill.  She states she is no other medical problems, takes medications daily.  No previous history of abdominal surgeries.  She is not been taking anything for her symptoms.  She staying well-hydrated with Pedialyte, water, and sports drinks.  She works as a Lawyer at a nursing home, and states that her boyfriend has similar symptoms.  She is currently on her period.  Additional history obtained from chart review.  Reviewed visit from 4 days ago.  At that time, patient declined labs or imaging.   HPI     Past Medical History:  Diagnosis Date  . Miscarriage     There are no problems to display for this patient.   Past Surgical History:  Procedure Laterality Date  . DILATION AND CURETTAGE OF UTERUS       OB History    Gravida  1   Para      Term      Preterm      AB      Living        SAB      TAB      Ectopic      Multiple      Live Births              History reviewed. No pertinent family history.  Social History   Tobacco Use  . Smoking status: Current Every Day Smoker    Packs/day: 0.20      Types: Cigarettes  . Smokeless tobacco: Never Used  Substance Use Topics  . Alcohol use: No  . Drug use: No    Home Medications Prior to Admission medications   Medication Sig Start Date End Date Taking? Authorizing Provider  HYDROcodone-acetaminophen (NORCO/VICODIN) 5-325 MG tablet Take 1 tablet by mouth every 4 (four) hours as needed. 10/25/18   Jacalyn Lefevre, MD  naproxen (NAPROSYN) 250 MG tablet Take 1 tablet (250 mg total) by mouth 2 (two) times daily with a meal. 08/03/16   Everlene Farrier, PA-C  ondansetron (ZOFRAN ODT) 4 MG disintegrating tablet Take 1 tablet (4 mg total) by mouth every 8 (eight) hours as needed for nausea or vomiting. 08/28/19   Rosibel Giacobbe, PA-C  Saccharomyces boulardii (PROBIOTIC) 250 MG CAPS Take 1 capsule by mouth daily. 02/14/19   Emi Holes, PA-C    Allergies    Patient has no known allergies.  Review of Systems   Review of Systems  Gastrointestinal: Positive for diarrhea and nausea.  All other systems reviewed and are negative.   Physical Exam  Updated Vital Signs BP 114/71 (BP Location: Right Arm)   Pulse 68   Temp 98.1 F (36.7 C)   SpO2 100%   Physical Exam Vitals and nursing note reviewed.  Constitutional:      General: She is not in acute distress.    Appearance: She is well-developed.     Comments: Resting comfortably in the bed in no acute distress  HENT:     Head: Normocephalic and atraumatic.  Eyes:     Extraocular Movements: Extraocular movements intact.     Conjunctiva/sclera: Conjunctivae normal.     Pupils: Pupils are equal, round, and reactive to light.  Cardiovascular:     Rate and Rhythm: Normal rate and regular rhythm.     Pulses: Normal pulses.  Pulmonary:     Effort: Pulmonary effort is normal. No respiratory distress.     Breath sounds: Normal breath sounds. No wheezing.  Abdominal:     General: There is no distension.     Palpations: Abdomen is soft. There is no mass.     Tenderness: There is no  abdominal tenderness. There is no guarding or rebound.     Comments: No tenderness palpation the abdomen.  No rigidity, guarding, distention.  Negative rebound.  No peritonitis.  Musculoskeletal:        General: Normal range of motion.     Cervical back: Normal range of motion and neck supple.  Skin:    General: Skin is warm and dry.     Capillary Refill: Capillary refill takes less than 2 seconds.  Neurological:     Mental Status: She is alert and oriented to person, place, and time.     ED Results / Procedures / Treatments   Labs (all labs ordered are listed, but only abnormal results are displayed) Labs Reviewed - No data to display  EKG None  Radiology No results found.  Procedures Procedures (including critical care time)  Medications Ordered in ED Medications - No data to display  ED Course  I have reviewed the triage vital signs and the nursing notes.  Pertinent labs & imaging results that were available during my care of the patient were reviewed by me and considered in my medical decision making (see chart for details).    MDM Rules/Calculators/A&P                      Patient presenting for evaluation continued diarrhea and intermittent nausea.  On exam, patient appears nontoxic.  Abdominal exam is reassuring.  History and physical consistent with improving viral GI illness.  Patient once again declines labs or imaging today.  She states she is here solely for work note and refill of her Zofran.  As her abdominal exam is reassuring, she appears nontoxic.  I am agreeable with not obtaining labs or imaging.  Encourage continued hydration and discussed diet to help control diarrhea.  She is still having diarrhea, will give work note.  Discussed prompt return to the ER with worsening symptoms, fever, abdominal pain.  At this time, patient appears safe for discharge.  Return precautions given.  Patient states she understands and agrees to plan.  Final Clinical  Impression(s) / ED Diagnoses Final diagnoses:  Diarrhea, unspecified type  Nausea    Rx / DC Orders ED Discharge Orders         Ordered    ondansetron (ZOFRAN ODT) 4 MG disintegrating tablet  Every 8 hours PRN     08/28/19 1004  Franchot Heidelberg, PA-C 08/28/19 1111    Tegeler, Gwenyth Allegra, MD 08/28/19 [phone]

## 2019-10-24 ENCOUNTER — Other Ambulatory Visit: Payer: Self-pay

## 2019-10-24 ENCOUNTER — Encounter (HOSPITAL_BASED_OUTPATIENT_CLINIC_OR_DEPARTMENT_OTHER): Payer: Self-pay | Admitting: *Deleted

## 2019-10-24 ENCOUNTER — Emergency Department (HOSPITAL_BASED_OUTPATIENT_CLINIC_OR_DEPARTMENT_OTHER)
Admission: EM | Admit: 2019-10-24 | Discharge: 2019-10-24 | Disposition: A | Discharge status: Home / Self Care | Payer: Self-pay | Attending: Emergency Medicine | Admitting: Emergency Medicine

## 2019-10-24 DIAGNOSIS — R1013 Epigastric pain: Secondary | ICD-10-CM | POA: Insufficient documentation

## 2019-10-24 DIAGNOSIS — F17210 Nicotine dependence, cigarettes, uncomplicated: Secondary | ICD-10-CM | POA: Insufficient documentation

## 2019-10-24 MED ORDER — OMEPRAZOLE 20 MG PO CPDR: 20.0000 mg | DELAYED_RELEASE_CAPSULE | Freq: Two times a day (BID) | ORAL | 1 refills | Status: AC

## 2019-10-24 NOTE — ED Provider Notes (Signed)
[facility] EMERGENCY DEPARTMENT Provider Note   CSN: [id number] Arrival date & time: 10/24/19  1407     History Chief Complaint  Patient presents with  . Abdominal Pain    Jill Kelley is a 24 y.o. female.  Patient is a 24 year old female with no significant past medical history.  She presents today for evaluation of abdominal discomfort.  Patient describes a burning in her epigastrium since yesterday.  She has felt somewhat bloated and reports belching and flatulence.  Patient went to work today and was instructed to seek medical attention prior to returning.  She denies to me she is having any fevers or chills.  She did have a negative Covid test at urgent care prior to coming here.  The history is provided by the patient.       Past Medical History:  Diagnosis Date  . Miscarriage     There are no problems to display for this patient.   Past Surgical History:  Procedure Laterality Date  . DILATION AND CURETTAGE OF UTERUS       OB History    Gravida  1   Para      Term      Preterm      AB      Living        SAB      TAB      Ectopic      Multiple      Live Births              No family history on file.  Social History   Tobacco Use  . Smoking status: Current Every Day Smoker    Packs/day: 0.20    Types: Cigarettes  . Smokeless tobacco: Never Used  Substance Use Topics  . Alcohol use: No  . Drug use: No    Home Medications Prior to Admission medications   Medication Sig Start Date End Date Taking? Authorizing Provider  naproxen (NAPROSYN) 250 MG tablet Take 1 tablet (250 mg total) by mouth 2 (two) times daily with a meal. 08/03/16  Yes Everlene Farrier, PA-C  HYDROcodone-acetaminophen (NORCO/VICODIN) 5-325 MG tablet Take 1 tablet by mouth every 4 (four) hours as needed. 10/25/18   Jacalyn Lefevre, MD  ondansetron (ZOFRAN ODT) 4 MG disintegrating tablet Take 1 tablet (4 mg total) by mouth every 8 (eight) hours as needed for  nausea or vomiting. 08/28/19   Caccavale, Sophia, PA-C  Saccharomyces boulardii (PROBIOTIC) 250 MG CAPS Take 1 capsule by mouth daily. 02/14/19   Emi Holes, PA-C    Allergies    Patient has no known allergies.  Review of Systems   Review of Systems  All other systems reviewed and are negative.   Physical Exam Updated Vital Signs BP (!) 103/51   Pulse 65   Temp 99.1 F (37.3 C) (Oral)   Resp 18   Ht 5\' 5"  (1.651 m)   Wt 79.8 kg   LMP 10/17/2019   SpO2 100%   BMI 29.28 kg/m   Physical Exam Vitals and nursing note reviewed.  Constitutional:      General: She is not in acute distress.    Appearance: She is well-developed. She is not diaphoretic.  HENT:     Head: Normocephalic and atraumatic.  Cardiovascular:     Rate and Rhythm: Normal rate and regular rhythm.     Heart sounds: No murmur heard.  No friction rub. No gallop.   Pulmonary:  Effort: Pulmonary effort is normal. No respiratory distress.     Breath sounds: Normal breath sounds. No wheezing.  Abdominal:     General: Bowel sounds are normal. There is no distension.     Palpations: Abdomen is soft.     Tenderness: There is no abdominal tenderness.  Musculoskeletal:        General: Normal range of motion.     Cervical back: Normal range of motion and neck supple.  Skin:    General: Skin is warm and dry.  Neurological:     Mental Status: She is alert and oriented to person, place, and time.     ED Results / Procedures / Treatments   Labs (all labs ordered are listed, but only abnormal results are displayed) Labs Reviewed - No data to display  EKG None  Radiology No results found.  Procedures Procedures (including critical care time)  Medications Ordered in ED Medications - No data to display  ED Course  I have reviewed the triage vital signs and the nursing notes.  Pertinent labs & imaging results that were available during my care of the patient were reviewed by me and considered in  my medical decision making (see chart for details).    MDM Rules/Calculators/A&P  Patient presenting with epigastric burning since yesterday evening.  She was told by her employer to come here to be checked.  Patient's abdominal exam is benign and vitals are stable.  She is afebrile.  Patient requesting a note to return to work.  She is not interested in undergoing any work-up.  She tells me she is actually feeling better and does not want any blood or imaging test performed.  She is also declining pregnancy test telling me that she just came off her period yesterday.  Final Clinical Impression(s) / ED Diagnoses Final diagnoses:  None    Rx / DC Orders ED Discharge Orders    None       Geoffery Lyons, MD 10/24/19 1645

## 2019-10-24 NOTE — ED Notes (Signed)
Had episode today of feeling a burning sensation in her chest, states she belched and then vomited, she states she feels fine. Pt points to epigastric to high sternum area

## 2019-10-24 NOTE — Discharge Instructions (Signed)
Begin taking Prilosec as prescribed.  Follow-up with primary doctor if not improving in the next week, and return to the ER if you develop worsening pain, high fever, bloody stool or vomit, or other new and concerning symptoms.

## 2019-10-24 NOTE — ED Notes (Signed)
ED Provider at bedside. 

## 2019-10-24 NOTE — ED Triage Notes (Signed)
Epigastric pain since last night. She had a negative Covid test today. Here job wants a work note before she can come back.

## 2019-10-24 NOTE — ED Notes (Signed)
States appetite is good, this incident only occurred x 1, pt states she feels like she has acid reflux

## 2021-07-23 DIAGNOSIS — Z3201 Encounter for pregnancy test, result positive: Secondary | ICD-10-CM | POA: Diagnosis not present

## 2021-08-18 DIAGNOSIS — Z3689 Encounter for other specified antenatal screening: Secondary | ICD-10-CM | POA: Diagnosis not present

## 2021-08-18 DIAGNOSIS — O3680X0 Pregnancy with inconclusive fetal viability, not applicable or unspecified: Secondary | ICD-10-CM | POA: Diagnosis not present

## 2021-08-18 DIAGNOSIS — O219 Vomiting of pregnancy, unspecified: Secondary | ICD-10-CM | POA: Diagnosis not present

## 2021-08-18 DIAGNOSIS — Z3A08 8 weeks gestation of pregnancy: Secondary | ICD-10-CM | POA: Diagnosis not present

## 2021-08-18 DIAGNOSIS — Z369 Encounter for antenatal screening, unspecified: Secondary | ICD-10-CM | POA: Diagnosis not present

## 2021-09-15 DIAGNOSIS — Z3481 Encounter for supervision of other normal pregnancy, first trimester: Secondary | ICD-10-CM | POA: Diagnosis not present

## 2021-10-13 DIAGNOSIS — Z3482 Encounter for supervision of other normal pregnancy, second trimester: Secondary | ICD-10-CM | POA: Diagnosis not present

## 2021-10-13 DIAGNOSIS — Z3A16 16 weeks gestation of pregnancy: Secondary | ICD-10-CM | POA: Diagnosis not present

## 2022-01-05 DIAGNOSIS — Z3689 Encounter for other specified antenatal screening: Secondary | ICD-10-CM | POA: Diagnosis not present

## 2022-01-09 DIAGNOSIS — O0993 Supervision of high risk pregnancy, unspecified, third trimester: Secondary | ICD-10-CM | POA: Diagnosis not present

## 2022-01-09 DIAGNOSIS — Z3A29 29 weeks gestation of pregnancy: Secondary | ICD-10-CM | POA: Diagnosis not present

## 2022-01-09 DIAGNOSIS — O24419 Gestational diabetes mellitus in pregnancy, unspecified control: Secondary | ICD-10-CM | POA: Diagnosis not present

## 2022-01-09 DIAGNOSIS — O99013 Anemia complicating pregnancy, third trimester: Secondary | ICD-10-CM | POA: Diagnosis not present

## 2022-01-19 DIAGNOSIS — O99013 Anemia complicating pregnancy, third trimester: Secondary | ICD-10-CM | POA: Diagnosis not present

## 2022-01-19 DIAGNOSIS — D649 Anemia, unspecified: Secondary | ICD-10-CM | POA: Diagnosis not present

## 2022-01-19 DIAGNOSIS — O24419 Gestational diabetes mellitus in pregnancy, unspecified control: Secondary | ICD-10-CM | POA: Diagnosis not present

## 2022-01-19 DIAGNOSIS — O09893 Supervision of other high risk pregnancies, third trimester: Secondary | ICD-10-CM | POA: Diagnosis not present

## 2022-01-19 DIAGNOSIS — Z3A30 30 weeks gestation of pregnancy: Secondary | ICD-10-CM | POA: Diagnosis not present

## 2022-01-19 DIAGNOSIS — Z3689 Encounter for other specified antenatal screening: Secondary | ICD-10-CM | POA: Diagnosis not present

## 2022-01-19 DIAGNOSIS — O0993 Supervision of high risk pregnancy, unspecified, third trimester: Secondary | ICD-10-CM | POA: Diagnosis not present

## 2022-02-16 DIAGNOSIS — Z3689 Encounter for other specified antenatal screening: Secondary | ICD-10-CM | POA: Diagnosis not present

## 2022-02-16 DIAGNOSIS — Z3A35 35 weeks gestation of pregnancy: Secondary | ICD-10-CM | POA: Diagnosis not present

## 2022-02-16 DIAGNOSIS — O24410 Gestational diabetes mellitus in pregnancy, diet controlled: Secondary | ICD-10-CM | POA: Diagnosis not present

## 2022-02-18 DIAGNOSIS — O0993 Supervision of high risk pregnancy, unspecified, third trimester: Secondary | ICD-10-CM | POA: Diagnosis not present

## 2022-02-18 DIAGNOSIS — O24419 Gestational diabetes mellitus in pregnancy, unspecified control: Secondary | ICD-10-CM | POA: Diagnosis not present

## 2022-02-18 DIAGNOSIS — Z3A35 35 weeks gestation of pregnancy: Secondary | ICD-10-CM | POA: Diagnosis not present

## 2022-03-02 DIAGNOSIS — Z3A36 36 weeks gestation of pregnancy: Secondary | ICD-10-CM | POA: Diagnosis not present

## 2022-03-02 DIAGNOSIS — O24410 Gestational diabetes mellitus in pregnancy, diet controlled: Secondary | ICD-10-CM | POA: Diagnosis not present

## 2022-03-09 DIAGNOSIS — Z3689 Encounter for other specified antenatal screening: Secondary | ICD-10-CM | POA: Diagnosis not present

## 2022-03-09 DIAGNOSIS — Z3A37 37 weeks gestation of pregnancy: Secondary | ICD-10-CM | POA: Diagnosis not present

## 2022-03-09 DIAGNOSIS — O24410 Gestational diabetes mellitus in pregnancy, diet controlled: Secondary | ICD-10-CM | POA: Diagnosis not present

## 2022-03-16 DIAGNOSIS — Z3689 Encounter for other specified antenatal screening: Secondary | ICD-10-CM | POA: Diagnosis not present

## 2022-03-16 DIAGNOSIS — Z3A38 38 weeks gestation of pregnancy: Secondary | ICD-10-CM | POA: Diagnosis not present

## 2022-03-16 DIAGNOSIS — Z3483 Encounter for supervision of other normal pregnancy, third trimester: Secondary | ICD-10-CM | POA: Diagnosis not present

## 2022-03-19 DIAGNOSIS — O99820 Streptococcus B carrier state complicating pregnancy: Secondary | ICD-10-CM | POA: Diagnosis not present

## 2022-03-19 DIAGNOSIS — Z3A39 39 weeks gestation of pregnancy: Secondary | ICD-10-CM | POA: Diagnosis not present

## 2022-03-19 DIAGNOSIS — B951 Streptococcus, group B, as the cause of diseases classified elsewhere: Secondary | ICD-10-CM | POA: Diagnosis not present

## 2022-03-19 DIAGNOSIS — O24410 Gestational diabetes mellitus in pregnancy, diet controlled: Secondary | ICD-10-CM | POA: Diagnosis not present

## 2022-03-20 DIAGNOSIS — O24410 Gestational diabetes mellitus in pregnancy, diet controlled: Secondary | ICD-10-CM | POA: Diagnosis not present

## 2022-03-20 DIAGNOSIS — B951 Streptococcus, group B, as the cause of diseases classified elsewhere: Secondary | ICD-10-CM | POA: Diagnosis not present

## 2022-03-20 DIAGNOSIS — O99820 Streptococcus B carrier state complicating pregnancy: Secondary | ICD-10-CM | POA: Diagnosis not present

## 2022-03-20 DIAGNOSIS — Z3A39 39 weeks gestation of pregnancy: Secondary | ICD-10-CM | POA: Diagnosis not present

## 2022-03-21 DIAGNOSIS — B951 Streptococcus, group B, as the cause of diseases classified elsewhere: Secondary | ICD-10-CM | POA: Diagnosis not present

## 2022-03-21 DIAGNOSIS — Z3A39 39 weeks gestation of pregnancy: Secondary | ICD-10-CM | POA: Diagnosis not present

## 2022-03-21 DIAGNOSIS — O24410 Gestational diabetes mellitus in pregnancy, diet controlled: Secondary | ICD-10-CM | POA: Diagnosis not present

## 2022-03-21 DIAGNOSIS — O99820 Streptococcus B carrier state complicating pregnancy: Secondary | ICD-10-CM | POA: Diagnosis not present

## 2022-09-30 DIAGNOSIS — U071 COVID-19: Secondary | ICD-10-CM | POA: Diagnosis not present

## 2022-09-30 DIAGNOSIS — R509 Fever, unspecified: Secondary | ICD-10-CM | POA: Diagnosis not present
